# Patient Record
Sex: Male | Born: 1965 | Race: White | Hispanic: No | Smoking: Current every day smoker
Health system: Southern US, Community
[De-identification: ages and names within clinical notes are randomized; demographics above are authoritative.]

## PROBLEM LIST (undated history)

## (undated) DIAGNOSIS — F319 Bipolar disorder, unspecified: Secondary | ICD-10-CM

## (undated) DIAGNOSIS — G5602 Carpal tunnel syndrome, left upper limb: Secondary | ICD-10-CM

## (undated) DIAGNOSIS — K219 Gastro-esophageal reflux disease without esophagitis: Secondary | ICD-10-CM

## (undated) DIAGNOSIS — G709 Myoneural disorder, unspecified: Secondary | ICD-10-CM

## (undated) DIAGNOSIS — F1010 Alcohol abuse, uncomplicated: Secondary | ICD-10-CM

---

## 1999-04-06 ENCOUNTER — Emergency Department (HOSPITAL_COMMUNITY): Admission: EM | Admit: 1999-04-06 | Discharge: 1999-04-06 | Payer: Self-pay | Admitting: Emergency Medicine

## 1999-10-28 ENCOUNTER — Emergency Department (HOSPITAL_COMMUNITY): Admission: EM | Admit: 1999-10-28 | Discharge: 1999-10-28 | Payer: Self-pay | Admitting: Emergency Medicine

## 1999-11-09 ENCOUNTER — Inpatient Hospital Stay (HOSPITAL_COMMUNITY): Admission: EM | Admit: 1999-11-09 | Discharge: 1999-11-15 | Payer: Self-pay | Admitting: Psychiatry

## 1999-11-25 ENCOUNTER — Inpatient Hospital Stay (HOSPITAL_COMMUNITY): Admission: EM | Admit: 1999-11-25 | Discharge: 1999-11-29 | Payer: Self-pay | Admitting: *Deleted

## 2001-09-19 ENCOUNTER — Encounter: Payer: Self-pay | Admitting: Gastroenterology

## 2001-09-19 ENCOUNTER — Encounter: Admission: RE | Admit: 2001-09-19 | Discharge: 2001-09-19 | Payer: Self-pay | Admitting: Gastroenterology

## 2001-11-14 ENCOUNTER — Ambulatory Visit (HOSPITAL_COMMUNITY): Admission: RE | Admit: 2001-11-14 | Discharge: 2001-11-14 | Payer: Self-pay | Admitting: Gastroenterology

## 2001-11-28 ENCOUNTER — Ambulatory Visit (HOSPITAL_COMMUNITY): Admission: RE | Admit: 2001-11-28 | Discharge: 2001-11-28 | Payer: Self-pay | Admitting: Gastroenterology

## 2001-11-28 ENCOUNTER — Encounter: Payer: Self-pay | Admitting: Gastroenterology

## 2002-04-11 HISTORY — PX: CARDIAC CATHETERIZATION: SHX172

## 2002-08-29 ENCOUNTER — Inpatient Hospital Stay (HOSPITAL_COMMUNITY): Admission: EM | Admit: 2002-08-29 | Discharge: 2002-08-30 | Payer: Self-pay | Admitting: Emergency Medicine

## 2002-08-29 ENCOUNTER — Encounter: Payer: Self-pay | Admitting: Emergency Medicine

## 2002-12-24 ENCOUNTER — Encounter: Payer: Self-pay | Admitting: Emergency Medicine

## 2002-12-24 ENCOUNTER — Emergency Department (HOSPITAL_COMMUNITY): Admission: EM | Admit: 2002-12-24 | Discharge: 2002-12-24 | Payer: Self-pay | Admitting: Emergency Medicine

## 2003-05-12 ENCOUNTER — Inpatient Hospital Stay (HOSPITAL_COMMUNITY): Admission: AD | Admit: 2003-05-12 | Discharge: 2003-05-15 | Payer: Self-pay | Admitting: Psychiatry

## 2003-05-16 ENCOUNTER — Other Ambulatory Visit (HOSPITAL_COMMUNITY): Admission: RE | Admit: 2003-05-16 | Discharge: 2003-05-29 | Payer: Self-pay | Admitting: Psychiatry

## 2003-05-21 ENCOUNTER — Emergency Department (HOSPITAL_COMMUNITY): Admission: EM | Admit: 2003-05-21 | Discharge: 2003-05-22 | Payer: Self-pay | Admitting: Emergency Medicine

## 2003-06-30 ENCOUNTER — Inpatient Hospital Stay (HOSPITAL_COMMUNITY): Admission: EM | Admit: 2003-06-30 | Discharge: 2003-07-01 | Payer: Self-pay | Admitting: Emergency Medicine

## 2003-07-01 ENCOUNTER — Inpatient Hospital Stay (HOSPITAL_COMMUNITY): Admission: RE | Admit: 2003-07-01 | Discharge: 2003-07-09 | Payer: Self-pay | Admitting: Psychiatry

## 2003-07-10 ENCOUNTER — Other Ambulatory Visit (HOSPITAL_COMMUNITY): Admission: RE | Admit: 2003-07-10 | Discharge: 2003-07-10 | Payer: Self-pay | Admitting: Psychiatry

## 2004-04-15 IMAGING — CT CT HEAD W/O CM
1 of 2 series · 13 of 30 positions shown, 17 images · non-contrast
Comparison: None.

CLINICAL DATA: Probable substance overdose. 
 CRANIAL CT ? WITHOUT CONTRAST
TECHNIQUE: 5 mm axial images were obtained from the skull base through the brain to the vertex.

[Series 6: — · axial · 0.47mm/px · z∈[-236,-111]mm · 13 of 31 slices shown, 17 images]
[im 3/31  brain]
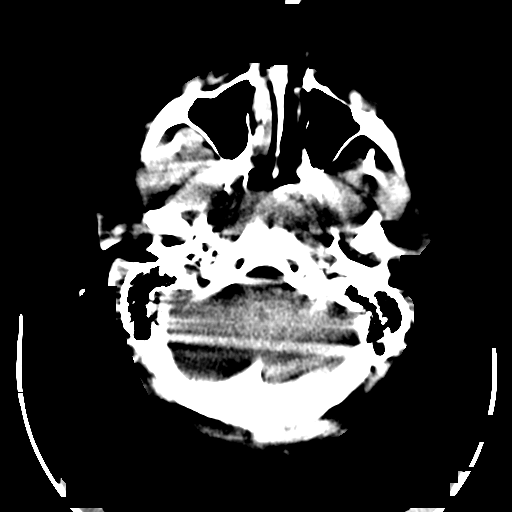
[im 3/31  bone]
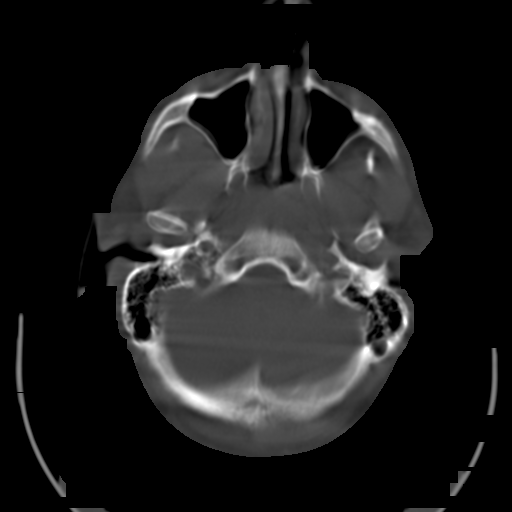
[im 5/31  brain]
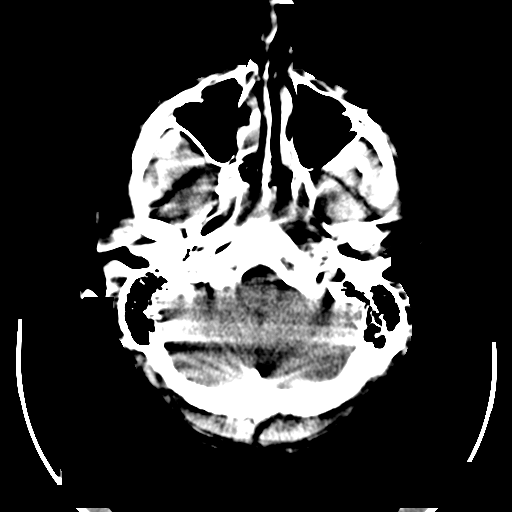
[im 7/31  brain]
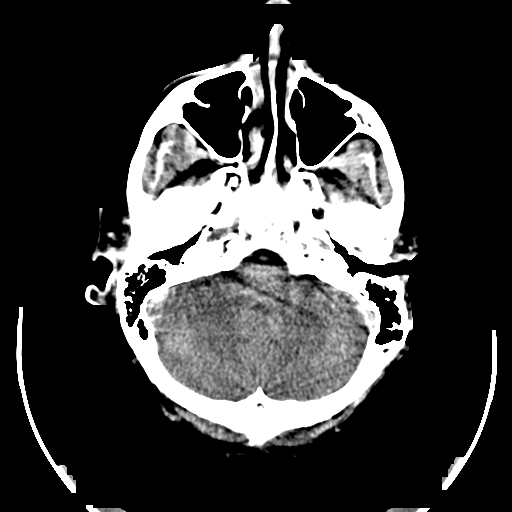
[im 9/31  brain]
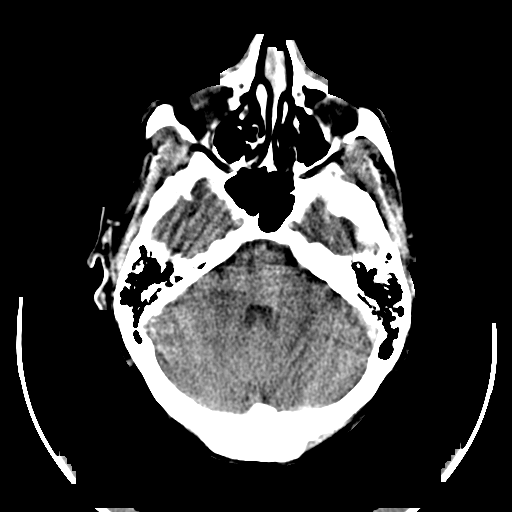
[im 11/31  brain]
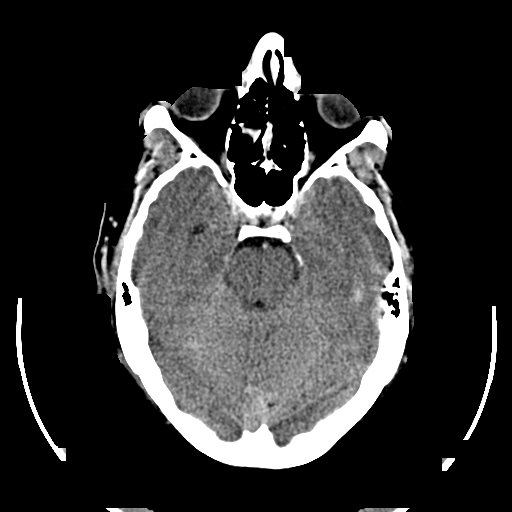
[im 11/31  bone]
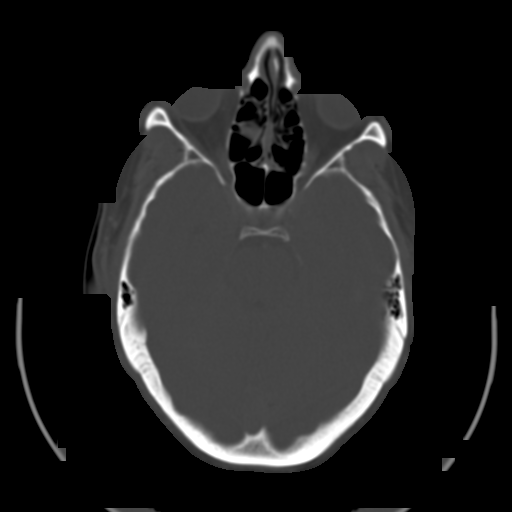
[im 13/31  brain]
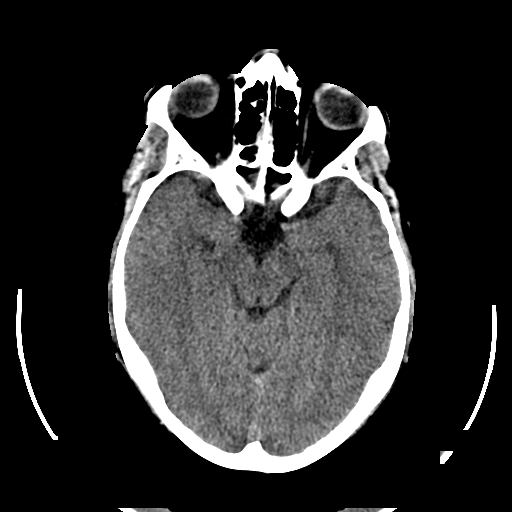
[im 16/31  brain]
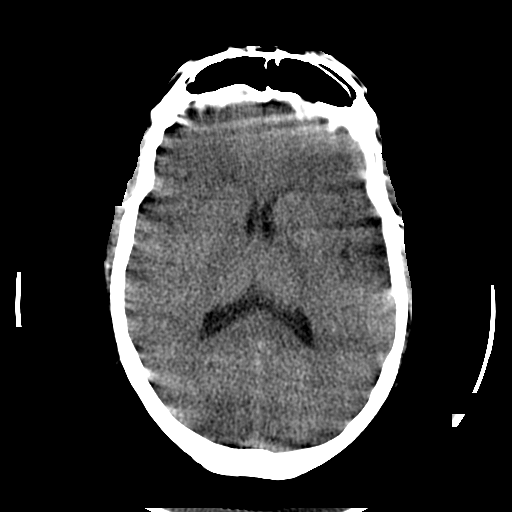
[im 18/31  brain]
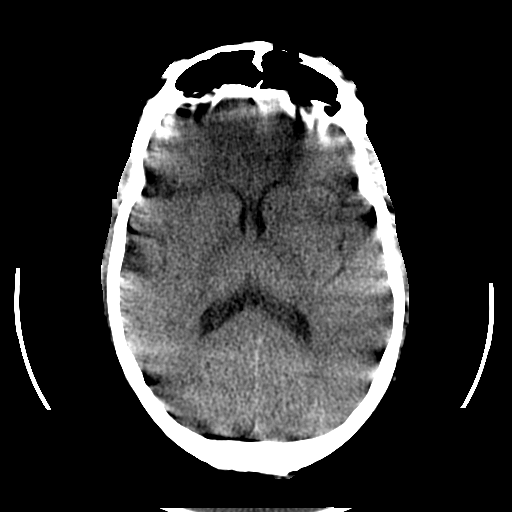
[im 20/31  brain]
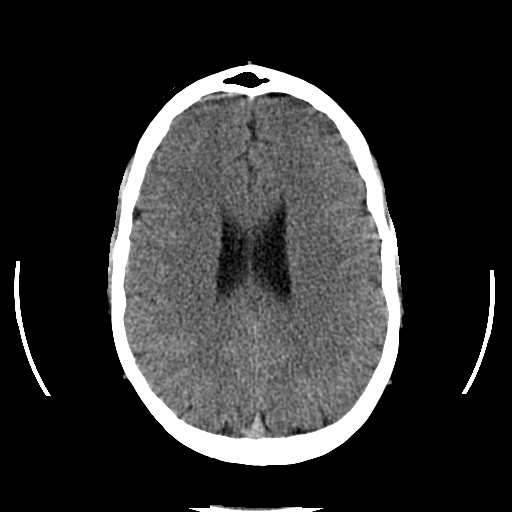
[im 20/31  bone]
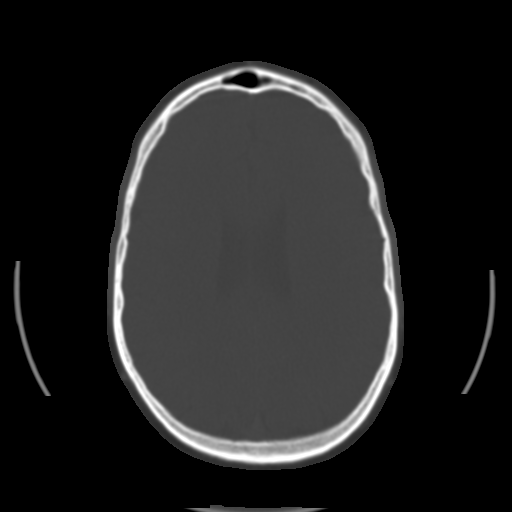
[im 22/31  brain]
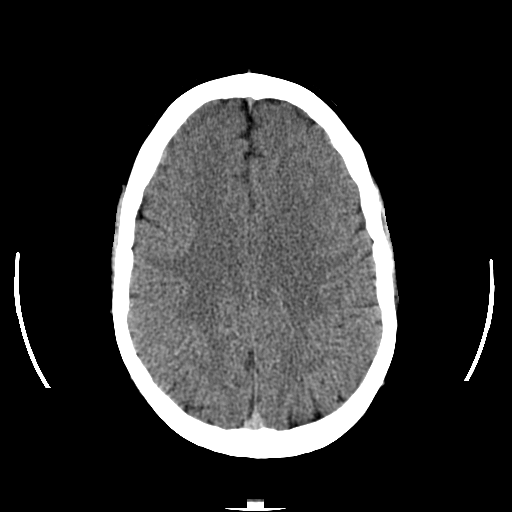
[im 24/31  brain]
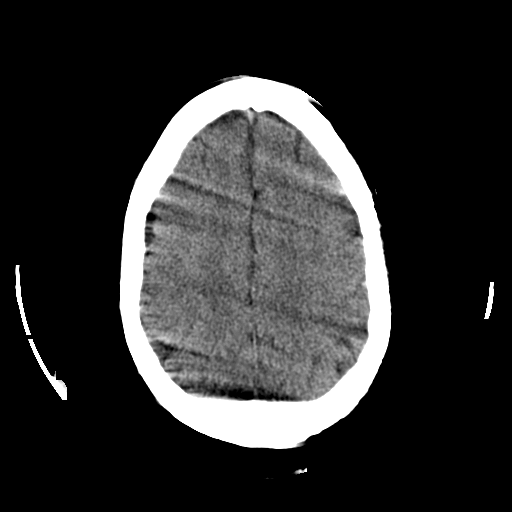
[im 26/31  brain]
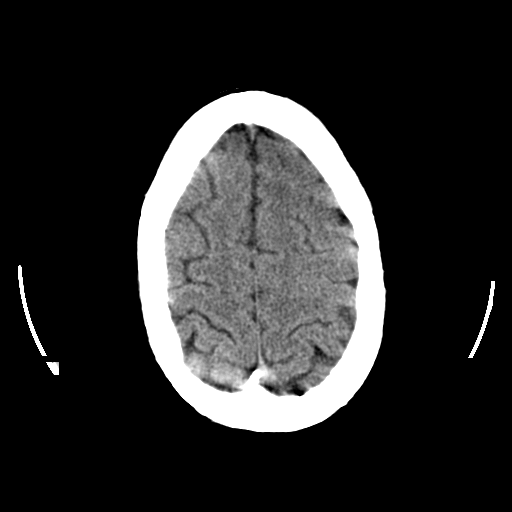
[im 28/31  brain]
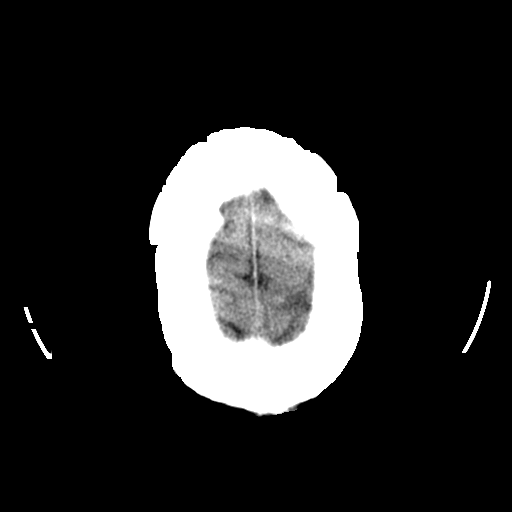
[im 28/31  bone]
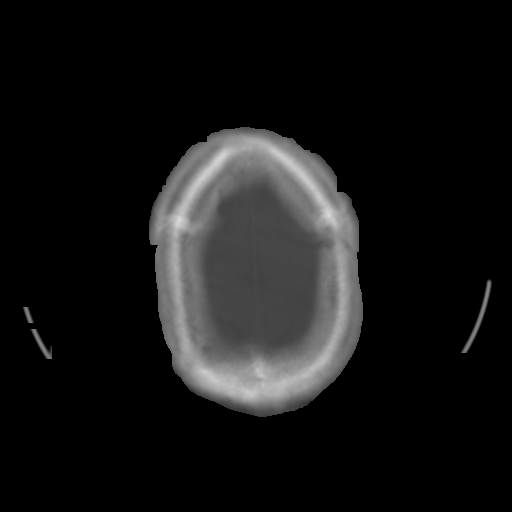

[13 of 30 positions shown; findings below may reference images not displayed]

FINDINGS: Patient motion degrades most of the images so the study is non-diagnostic.  I am able to tell that the ventricular system is normal in size and appearance.  There is no mass effect or midline shift.  I see no gross hemorrhage or hematoma.  No extra-axial fluid collections are identified. 
 The bone window images demonstrate no gross abnormalities involving the skull.  The visualized paranasal sinuses and mastoid air cells appear well aerated. 
 IMPRESSION
 Suboptimal due to patient motion.  No gross abnormalities.

## 2004-04-26 ENCOUNTER — Ambulatory Visit: Payer: Self-pay | Admitting: Psychiatry

## 2004-04-26 ENCOUNTER — Inpatient Hospital Stay (HOSPITAL_COMMUNITY): Admission: RE | Admit: 2004-04-26 | Discharge: 2004-04-30 | Payer: Self-pay | Admitting: Psychiatry

## 2004-08-06 ENCOUNTER — Emergency Department (HOSPITAL_COMMUNITY): Admission: EM | Admit: 2004-08-06 | Discharge: 2004-08-06 | Payer: Self-pay | Admitting: Emergency Medicine

## 2005-02-28 ENCOUNTER — Emergency Department (HOSPITAL_COMMUNITY): Admission: EM | Admit: 2005-02-28 | Discharge: 2005-02-28 | Payer: Self-pay | Admitting: Emergency Medicine

## 2005-05-23 ENCOUNTER — Inpatient Hospital Stay (HOSPITAL_COMMUNITY): Admission: EM | Admit: 2005-05-23 | Discharge: 2005-05-28 | Payer: Self-pay | Admitting: Psychiatry

## 2005-05-23 ENCOUNTER — Emergency Department (HOSPITAL_COMMUNITY): Admission: EM | Admit: 2005-05-23 | Discharge: 2005-05-23 | Payer: Self-pay | Admitting: Emergency Medicine

## 2005-05-26 ENCOUNTER — Ambulatory Visit (HOSPITAL_COMMUNITY): Admission: RE | Admit: 2005-05-26 | Discharge: 2005-05-26 | Payer: Self-pay | Admitting: Psychiatry

## 2005-08-28 ENCOUNTER — Emergency Department (HOSPITAL_COMMUNITY): Admission: EM | Admit: 2005-08-28 | Discharge: 2005-08-28 | Payer: Self-pay | Admitting: Emergency Medicine

## 2005-08-30 ENCOUNTER — Emergency Department (HOSPITAL_COMMUNITY): Admission: EM | Admit: 2005-08-30 | Discharge: 2005-08-30 | Payer: Self-pay | Admitting: Emergency Medicine

## 2005-10-25 ENCOUNTER — Emergency Department (HOSPITAL_COMMUNITY): Admission: EM | Admit: 2005-10-25 | Discharge: 2005-10-26 | Payer: Self-pay | Admitting: Emergency Medicine

## 2006-04-28 ENCOUNTER — Emergency Department (HOSPITAL_COMMUNITY): Admission: EM | Admit: 2006-04-28 | Discharge: 2006-04-29 | Payer: Self-pay | Admitting: Emergency Medicine

## 2006-04-28 ENCOUNTER — Emergency Department (HOSPITAL_COMMUNITY): Admission: EM | Admit: 2006-04-28 | Discharge: 2006-04-28 | Payer: Self-pay | Admitting: Emergency Medicine

## 2006-05-01 ENCOUNTER — Emergency Department (HOSPITAL_COMMUNITY): Admission: EM | Admit: 2006-05-01 | Discharge: 2006-05-01 | Payer: Self-pay | Admitting: Emergency Medicine

## 2006-05-03 ENCOUNTER — Ambulatory Visit: Payer: Self-pay | Admitting: Internal Medicine

## 2006-05-03 ENCOUNTER — Inpatient Hospital Stay (HOSPITAL_COMMUNITY): Admission: AD | Admit: 2006-05-03 | Discharge: 2006-05-06 | Payer: Self-pay | Admitting: Internal Medicine

## 2006-05-12 ENCOUNTER — Ambulatory Visit: Payer: Self-pay | Admitting: Internal Medicine

## 2006-06-01 ENCOUNTER — Ambulatory Visit: Payer: Self-pay | Admitting: Physical Medicine & Rehabilitation

## 2006-06-01 ENCOUNTER — Encounter
Admission: RE | Admit: 2006-06-01 | Discharge: 2006-08-30 | Payer: Self-pay | Admitting: Physical Medicine & Rehabilitation

## 2006-08-31 ENCOUNTER — Ambulatory Visit: Payer: Self-pay | Admitting: Psychiatry

## 2006-08-31 ENCOUNTER — Inpatient Hospital Stay (HOSPITAL_COMMUNITY): Admission: RE | Admit: 2006-08-31 | Discharge: 2006-09-07 | Payer: Self-pay | Admitting: Psychiatry

## 2006-09-03 ENCOUNTER — Emergency Department (HOSPITAL_COMMUNITY): Admission: EM | Admit: 2006-09-03 | Discharge: 2006-09-04 | Payer: Self-pay | Admitting: Emergency Medicine

## 2006-09-05 ENCOUNTER — Ambulatory Visit: Payer: Self-pay | Admitting: Vascular Surgery

## 2006-09-05 ENCOUNTER — Encounter (HOSPITAL_COMMUNITY): Payer: Self-pay | Admitting: Psychiatry

## 2006-12-06 ENCOUNTER — Ambulatory Visit (HOSPITAL_COMMUNITY): Payer: Self-pay | Admitting: Psychiatry

## 2006-12-24 ENCOUNTER — Inpatient Hospital Stay (HOSPITAL_COMMUNITY): Admission: EM | Admit: 2006-12-24 | Discharge: 2006-12-29 | Payer: Self-pay | Admitting: Emergency Medicine

## 2006-12-29 ENCOUNTER — Inpatient Hospital Stay (HOSPITAL_COMMUNITY): Admission: EM | Admit: 2006-12-29 | Discharge: 2007-01-01 | Payer: Self-pay | Admitting: Psychiatry

## 2007-01-01 ENCOUNTER — Ambulatory Visit: Payer: Self-pay | Admitting: Psychiatry

## 2007-01-03 ENCOUNTER — Ambulatory Visit (HOSPITAL_COMMUNITY): Payer: Self-pay | Admitting: Psychiatry

## 2007-01-31 ENCOUNTER — Ambulatory Visit (HOSPITAL_COMMUNITY): Payer: Self-pay | Admitting: Psychiatry

## 2010-02-07 ENCOUNTER — Emergency Department (HOSPITAL_COMMUNITY): Admission: EM | Admit: 2010-02-07 | Discharge: 2010-02-07 | Payer: Self-pay | Admitting: Emergency Medicine

## 2010-05-02 ENCOUNTER — Encounter: Payer: Self-pay | Admitting: Physical Medicine & Rehabilitation

## 2010-06-10 HISTORY — PX: MANDIBLE FRACTURE SURGERY: SHX706

## 2010-06-13 ENCOUNTER — Emergency Department (HOSPITAL_COMMUNITY)
Admission: EM | Admit: 2010-06-13 | Discharge: 2010-06-13 | Disposition: A | Discharge status: Home / Self Care | Payer: Managed Care, Other (non HMO) | Attending: Emergency Medicine | Admitting: Emergency Medicine

## 2010-06-13 ENCOUNTER — Emergency Department (HOSPITAL_COMMUNITY): Payer: Managed Care, Other (non HMO)

## 2010-06-13 DIAGNOSIS — S0993XA Unspecified injury of face, initial encounter: Secondary | ICD-10-CM | POA: Insufficient documentation

## 2010-06-13 DIAGNOSIS — S025XXA Fracture of tooth (traumatic), initial encounter for closed fracture: Secondary | ICD-10-CM | POA: Insufficient documentation

## 2010-06-13 DIAGNOSIS — S02600A Fracture of unspecified part of body of mandible, initial encounter for closed fracture: Secondary | ICD-10-CM | POA: Insufficient documentation

## 2010-06-13 DIAGNOSIS — K219 Gastro-esophageal reflux disease without esophagitis: Secondary | ICD-10-CM | POA: Insufficient documentation

## 2010-06-13 DIAGNOSIS — Y93E1 Activity, personal bathing and showering: Secondary | ICD-10-CM | POA: Insufficient documentation

## 2010-06-13 DIAGNOSIS — K089 Disorder of teeth and supporting structures, unspecified: Secondary | ICD-10-CM | POA: Insufficient documentation

## 2010-06-13 DIAGNOSIS — R6884 Jaw pain: Secondary | ICD-10-CM | POA: Insufficient documentation

## 2010-06-13 DIAGNOSIS — Y92009 Unspecified place in unspecified non-institutional (private) residence as the place of occurrence of the external cause: Secondary | ICD-10-CM | POA: Insufficient documentation

## 2010-06-13 DIAGNOSIS — I10 Essential (primary) hypertension: Secondary | ICD-10-CM | POA: Insufficient documentation

## 2010-06-13 DIAGNOSIS — Z79899 Other long term (current) drug therapy: Secondary | ICD-10-CM | POA: Insufficient documentation

## 2010-06-13 DIAGNOSIS — R220 Localized swelling, mass and lump, head: Secondary | ICD-10-CM | POA: Insufficient documentation

## 2010-06-13 DIAGNOSIS — W010XXA Fall on same level from slipping, tripping and stumbling without subsequent striking against object, initial encounter: Secondary | ICD-10-CM | POA: Insufficient documentation

## 2010-06-13 DIAGNOSIS — K1370 Unspecified lesions of oral mucosa: Secondary | ICD-10-CM | POA: Insufficient documentation

## 2010-06-13 DIAGNOSIS — S199XXA Unspecified injury of neck, initial encounter: Secondary | ICD-10-CM | POA: Insufficient documentation

## 2010-06-13 LAB — POCT I-STAT, CHEM 8
BUN: 12 mg/dL (ref 6–23)
Calcium, Ion: 1.07 mmol/L — ABNORMAL LOW (ref 1.12–1.32)
Chloride: 107 mEq/L (ref 96–112)
Creatinine, Ser: 0.8 mg/dL (ref 0.4–1.5)
Glucose, Bld: 102 mg/dL — ABNORMAL HIGH (ref 70–99)
HCT: 53 % — ABNORMAL HIGH (ref 39.0–52.0)
Potassium: 4.2 mEq/L (ref 3.5–5.1)
Sodium: 137 mEq/L (ref 135–145)
TCO2: 22 mmol/L (ref 0–100)

## 2010-06-13 LAB — CBC
HCT: 48.9 % (ref 39.0–52.0)
Hemoglobin: 17.8 g/dL — ABNORMAL HIGH (ref 13.0–17.0)
MCHC: 36.4 g/dL — ABNORMAL HIGH (ref 30.0–36.0)
MCV: 86.4 fL (ref 78.0–100.0)
Platelets: 230 10*3/uL (ref 150–400)
RBC: 5.66 MIL/uL (ref 4.22–5.81)
RDW: 12.8 % (ref 11.5–15.5)
WBC: 12.6 10*3/uL — ABNORMAL HIGH (ref 4.0–10.5)

## 2010-06-13 LAB — DIFFERENTIAL
Eosinophils Absolute: 0 10*3/uL (ref 0.0–0.7)
Lymphocytes Relative: 23 % (ref 12–46)
Lymphs Abs: 2.8 10*3/uL (ref 0.7–4.0)
Monocytes Absolute: 1.1 10*3/uL — ABNORMAL HIGH (ref 0.1–1.0)
Monocytes Relative: 9 % (ref 3–12)
Neutrophils Relative %: 68 % (ref 43–77)

## 2010-06-22 NOTE — Op Note (Signed)
NAME:  [name], [name]               ACCOUNT NO.:  [id number]  MEDICAL RECORD NO.:  [id number]           PATIENT TYPE:  E  LOCATION:  [facility]                         FACILITY:  [facility]  PHYSICIAN:  Jniya Madara T. Lazarus Salines, M.D. DATE OF BIRTH:  12/23/1965  DATE OF PROCEDURE:  06/13/2010 DATE OF DISCHARGE:                              OPERATIVE REPORT   PREOPERATIVE DIAGNOSIS:  Left mandibular angle fracture.  POSTOPERATIVE DIAGNOSIS:  Left mandibular angle fracture.  PROCEDURE PERFORMED:  Mandibular-maxillary fixation.  SURGEON:  Gloris Manchester. Chrystopher Stangl, MD  ANESTHESIA:  General nasotracheal.  BLOOD LOSS:  Minimal.  COMPLICATIONS:  None.  FINDINGS:  Crowded dentition.  A posterior vertical height open bite deformity on the left with a laceration in the mandibular alveolar mucosa just behind the most-posterior tooth.  Jaw readily brought into good occlusion and secured fixation.  PROCEDURE:  With the patient in a comfortable supine position, having received preoperative Neo-Synephrine nasal spray, general nasotracheal anesthesia was administered without difficulty.  At an appropriate level, the patient was placed in a slight sitting position.  A 2 x 2 gauze with a 2-0 silk suture tied to it and was used as a throat pack and was placed without difficulty.  The dentition was scrubbed with Betadine solution and a toothbrush and the external face was cleaned with the same solution.  Xylocaine 1% with 1:100,000 epinephrine, 6 mL total was infiltrated into the proposed puncture sites for the mandibulomaxillary fixation screws.  Several minutes were allowed for this take effect.  The oral cavity was rinsed and suctioned clear.  The #15 blade punctures were made medial and superior to the roots of the canine teeth on the maxilla lateral to the piriform aperture and on the mandible.  A 12-mm bicortical screws were placed in the standard fashion and were secured.  Starting with a 22 gauge wire, a  single loop was applied vertically on the right side and with a tongue carefully placed back into the oral cavity and with the teeth gently brought into occlusion, this was secured.  It was felt that this was too large wire and the remainder of the wires were 24-gauge.  An additional vertical loop was placed on the left side both loosely tightened until the occlusion was established and then more securely snugged down and then cut off.  Two crisscross loops were applied of the 24 gauge wire. Excellent occlusion was observed and good fixation.  The cut ends of the twisted wires were twisted down to be buried and the wires were adjusted so that they were not overlying any excessive amount of soft tissue.  At this point, a hemostasis was observed.  The oral cavity was irrigated and suctioned clear.  The patient was returned to Anesthesia, awakened, extubated, and transferred to the recovery in stable condition.  COMMENT:  A 45 year old white male fell last night in the bathtub sustaining a fracture of the left angle of the mandible as the indication for today's procedure.  Anticipate routine postoperative recovery with attention to ice, elevation, analgesia.  We will give him liquid diet instructions.  We will send him home with  wire cutters in case he should have to loosen his occlusion for emesis.  Given low anticipated risk of postanesthetic or postsurgical complications, I feel an outpatient venue is appropriate.     Gloris Manchester. Lazarus Salines, M.D.     KTW/MEDQ  D:  06/13/2010  T:  06/14/2010  Job:  [id number]  Electronically Signed by [name] M.D. on 06/22/2010 08:04:21 AM

## 2010-06-22 NOTE — Consult Note (Signed)
  NAME:  [name], [name]               ACCOUNT NO.:  [id number]  MEDICAL RECORD NO.:  [id number]           PATIENT TYPE:  E  LOCATION:  [facility]                         FACILITY:  [facility]  PHYSICIAN:  Chaka Boyson T. Lazarus Salines, M.D. DATE OF BIRTH:  1966-04-06  DATE OF CONSULTATION: DATE OF DISCHARGE:                                CONSULTATION   CHIEF COMPLAINT:  Jaw pain.  HISTORY OF PRESENT ILLNESS:  A 45 year old white male slipped and fell in the bathtub roughly 12 hours ago.  Within an hour or two, he was having intense pain in the left side of his jaw and came in for evaluation.  He had a small amount of oral bleeding.  No breathing difficulty.  No prior history of jaw fracture.  He notes numbness over his left chin, and difficulty closing his mouth to bring his teeth into occlusion.  PAST MEDICAL HISTORY:  No prior surgeries.  No current active medical conditions except reflux.  He had a prior colonoscopy.  FAMILY HISTORY:  Negative for bleeding or anesthesia reaction.  SOCIAL HISTORY:  Two pack-per-day smoker.  Occasional heavy binge drinker.  No known drug allergies.  Current medications include Prilosec.  REVIEW OF SYSTEMS:  Noncontributory.  PHYSICAL EXAMINATION:  VITAL SIGNS:  Temperature 98.3, pulse 120, respirations 20, blood pressure 185/106. HEENT:  He has tender swelling over the left angle of the mandible without crepitus or ecchymosis.  Otherwise, the face appears atraumatic. Ear canals are clear with aerated drums.  Anterior nose is dry consistent with his smoking.  Oral cavity reveals teeth for the most part in good repair.  There is a mucosal laceration over the left posterior mandibular alveolus.  He has a mild open bite deformity with pain on attempting to bring his teeth in to better occlusion. Oropharynx is fleshy, but clear. NECK:  Without adenopathy.  IMPRESSION:  Left mandibular angle fracture.  PLAN:  I reviewed the CT scan demonstrating the findings as  above with a displaced angle fracture behind the posterior mandibular teeth.  He may need mandibular maxillary fixation to stabilize his occlusion and preserved vertical height.  I have discussed this with him including risks and complications.  Questions were answered and informed consent was obtained.  A routine preoperative history and physical was recorded in the standard fashion.     Gloris Manchester. Lazarus Salines, M.D.     KTW/MEDQ  D:  06/13/2010  T:  06/14/2010  Job:  [id number]  Electronically Signed by [name] M.D. on 06/22/2010 08:04:26 AM

## 2010-07-04 ENCOUNTER — Emergency Department (HOSPITAL_COMMUNITY): Payer: Managed Care, Other (non HMO)

## 2010-07-04 ENCOUNTER — Emergency Department (HOSPITAL_COMMUNITY)
Admission: EM | Admit: 2010-07-04 | Discharge: 2010-07-04 | Disposition: A | Discharge status: Home / Self Care | Payer: Managed Care, Other (non HMO) | Attending: Emergency Medicine | Admitting: Emergency Medicine

## 2010-07-04 DIAGNOSIS — R000 Tachycardia, unspecified: Secondary | ICD-10-CM | POA: Insufficient documentation

## 2010-07-04 DIAGNOSIS — W010XXA Fall on same level from slipping, tripping and stumbling without subsequent striking against object, initial encounter: Secondary | ICD-10-CM | POA: Insufficient documentation

## 2010-07-04 DIAGNOSIS — K219 Gastro-esophageal reflux disease without esophagitis: Secondary | ICD-10-CM | POA: Insufficient documentation

## 2010-07-04 DIAGNOSIS — S0993XA Unspecified injury of face, initial encounter: Secondary | ICD-10-CM | POA: Insufficient documentation

## 2010-07-04 DIAGNOSIS — K1370 Unspecified lesions of oral mucosa: Secondary | ICD-10-CM | POA: Insufficient documentation

## 2010-07-04 DIAGNOSIS — R6884 Jaw pain: Secondary | ICD-10-CM | POA: Insufficient documentation

## 2010-07-04 DIAGNOSIS — I10 Essential (primary) hypertension: Secondary | ICD-10-CM | POA: Insufficient documentation

## 2010-07-04 DIAGNOSIS — S199XXA Unspecified injury of neck, initial encounter: Secondary | ICD-10-CM | POA: Insufficient documentation

## 2010-07-04 DIAGNOSIS — Y929 Unspecified place or not applicable: Secondary | ICD-10-CM | POA: Insufficient documentation

## 2010-07-04 DIAGNOSIS — R509 Fever, unspecified: Secondary | ICD-10-CM | POA: Insufficient documentation

## 2010-07-04 LAB — DIFFERENTIAL
Basophils Absolute: 0 10*3/uL (ref 0.0–0.1)
Basophils Relative: 0 % (ref 0–1)
Eosinophils Absolute: 0.2 10*3/uL (ref 0.0–0.7)
Eosinophils Relative: 2 % (ref 0–5)
Lymphocytes Relative: 32 % (ref 12–46)
Lymphs Abs: 2.4 10*3/uL (ref 0.7–4.0)
Monocytes Absolute: 0.6 10*3/uL (ref 0.1–1.0)
Monocytes Relative: 8 % (ref 3–12)
Neutro Abs: 4.4 10*3/uL (ref 1.7–7.7)

## 2010-07-04 LAB — CBC
HCT: 46.3 % (ref 39.0–52.0)
MCHC: 36.3 g/dL — ABNORMAL HIGH (ref 30.0–36.0)
Platelets: 279 10*3/uL (ref 150–400)
RDW: 12.5 % (ref 11.5–15.5)

## 2010-07-04 LAB — BASIC METABOLIC PANEL
BUN: 5 mg/dL — ABNORMAL LOW (ref 6–23)
Calcium: 9 mg/dL (ref 8.4–10.5)
GFR calc non Af Amer: >60 mL/min (ref >60)
Glucose, Bld: 101 mg/dL — ABNORMAL HIGH (ref 70–99)
Sodium: 134 mEq/L — ABNORMAL LOW (ref 135–145)

## 2010-07-21 ENCOUNTER — Other Ambulatory Visit: Payer: Self-pay | Admitting: Otolaryngology

## 2010-07-21 ENCOUNTER — Ambulatory Visit (HOSPITAL_COMMUNITY)
Admission: RE | Admit: 2010-07-21 | Discharge: 2010-07-21 | Disposition: A | Discharge status: Home / Self Care | Payer: Managed Care, Other (non HMO) | Source: Ambulatory Visit | Attending: Otolaryngology | Admitting: Otolaryngology

## 2010-07-21 DIAGNOSIS — R52 Pain, unspecified: Secondary | ICD-10-CM

## 2010-07-21 DIAGNOSIS — R6884 Jaw pain: Secondary | ICD-10-CM | POA: Insufficient documentation

## 2010-07-21 DIAGNOSIS — IMO0001 Reserved for inherently not codable concepts without codable children: Secondary | ICD-10-CM | POA: Insufficient documentation

## 2010-08-24 NOTE — Discharge Summary (Signed)
NAME:  [name], [name]               ACCOUNT NO.:  [id number]   MEDICAL RECORD NO.:  [id number]          PATIENT TYPE:  IPS   LOCATION:  0307                          FACILITY:  [facility]   PHYSICIAN:  Geoffery Lyons, M.D.      DATE OF BIRTH:  [birth year]   DATE OF ADMISSION:  12/29/2006  DATE OF DISCHARGE:  01/01/2007                               DISCHARGE SUMMARY   CHIEF COMPLAINT AND PRESENT ILLNESS:  This was the fifth admission to  [facility] for this 45 year old divorced white male  who entered the hospital on December 24, 2006 as a result of an  overdose.  He had taken 10-12 tablets of 300 mg Neurontin and clonidine  0.2 mg, unknown number.  He was started on a dopamine drip and admitted  to the cardiac intensive care unit, stabilized and transferred to the  main floor.  Experienced also acute bronchitis, was given Avelox,  Mucinex and albuterol.  Was seen in consultation by Dr. Jeanie Sewer.  Apparently, he ran out of the Neurontin and began to reuse alcohol, a  half to a fifth of alcohol per day for four weeks.  Developed increased  depressed mood.  Difficulty concentrating with anhedonia, insomnia,  suicidal thoughts, excessive worry, feeling on edge, having muscle  tension.   PAST PSYCHIATRIC HISTORY:  Fifth time at [facility].  Did not  follow up last time he was admitted.   ALCOHOL/DRUG HISTORY:  Using alcohol on and off for the past few years.   MEDICAL HISTORY:  Hypertension, gastroesophageal reflux.   MEDICATIONS:  Transferred from the main hospital on Avelox 400 mg per  day, clonidine 0.2 mg three times a day, Mucinex 600 mg twice a day,  labetalol 200 mg twice a day.   PHYSICAL EXAMINATION:  Performed and failed to show any acute findings.   LABORATORY DATA:  CBC revealed white blood cells 5.1, hemoglobin 16.5.  Sodium 136, potassium 3.9, glucose 111, BUN 9, creatinine 1.12.  SGOT  38, SGPT 44, TSH 4.279.   MENTAL STATUS EXAM:  Alert, cooperative  male.  Mood was somewhat  irritable.  Affect was depressed.  Thought processes were clear,  rational and goal-oriented.  Pretty knowledgeable about his medication,  what he would and would not take.  No active suicidal or homicidal  ideation.  No evidence of delusions.  No hallucinations.  Cognition well-  preserved.   ADMISSION DIAGNOSES:  AXIS I:  Bipolar disorder, depressed.  Anxiety  disorder not otherwise specified.  Alcohol dependence.  AXIS II:  No diagnosis.  AXIS III:  Hypertension, gastroesophageal reflux, bronchitis.  AXIS IV:  Moderate.  AXIS V:  GAF upon admission 30; highest GAF in the last year 65-70.   HOSPITAL COURSE:  He was admitted and started in individual and group  psychotherapy.  He was maintained on his medications.  As already  stated, transferred from the medical unit when medically stable, status  post overdose with clonidine and Neurontin.  Developed depressed mood,  decreased concentration, anhedonia, insomnia, suicidal thoughts.  He was  insightful about how he started using  alcohol and how it became a  problem.  Has been on multiple medications, feeling sedated.  By  December 31, 2006, endorsed that he could not do this anymore.  He was  wanting to get his life back together, committed to abstinence.  Endorsed that he resolved not to drink but, by the end of the day, he  was already craving it, willing to use Antabuse but he was denying any  suicidal thoughts.  Endorsed that alcohol plays a big role in all that  goes on with him so he was placed on Antabuse 250 mg per day.  On  January 01, 2007, he was in full contact with reality.  No suicidal or  homicidal ideation.  No hallucinations.  No delusions.  Committed to  abstinence.  Endorsed that he was not wanting to hurt himself and that  this gesture was stupid.  Endorsed that he was going to follow up on  an outpatient basis.   DISCHARGE DIAGNOSES:  AXIS I:  Alcohol dependence.  Mood disorder not   otherwise specified.  AXIS II:  No diagnosis.  AXIS III:  Gastroesophageal reflux, arterial hypertension.  AXIS IV:  Moderate.  AXIS V:  GAF upon discharge 55-60.   DISCHARGE MEDICATIONS:  1. Protonix 40 mg per day.  2. Avelox 400 mg daily three more days.  3. Mucinex 600 mg twice a day.  4. Normodyne 200 mg twice a day.  5. Catapres 0.2 mg three times a day.  6. Neurontin 300 mg three times a day.   FOLLOWUP:  Yolande Jolly, PA at [facility].      Geoffery Lyons, M.D.  Electronically Signed     IL/MEDQ  D:  01/22/2007  T:  01/23/2007  Job:  [id number]

## 2010-08-24 NOTE — Consult Note (Signed)
NAME:  [name], [name]               ACCOUNT NO.:  [id number]   MEDICAL RECORD NO.:  [id number]           PATIENT TYPE:   LOCATION:                                 FACILITY:   PHYSICIAN:  Antonietta Breach, M.D.  DATE OF BIRTH:  1965-05-09   DATE OF CONSULTATION:  12/28/2006  DATE OF DISCHARGE:                                 CONSULTATION   [name] continues with severe depressed mood, low energy, difficulty  concentrating, anhedonia.  He stays in his bed most the time.   He is not having any hallucinations or delusions.  He is oriented  completely.  He is cooperative with bedside care.   LABORATORY DATA:  SGOT 31, SGPT 40, WBC 5.4, hemoglobin 15.7, platelet  count 235.   REVIEW OF SYSTEMS:  The patient is not having any nausea or vomiting.  He is not having any shakes or sweats.  He has received a milligram of  Ativan today and has finished his withdrawal protocol.   EXAMINATION:  VITAL SIGNS:  Temperature 98.2, pulse 85, respiratory rate  22, blood pressure 146/93, O2 saturation on room air is 98%.   MENTAL STATUS EXAM:  As above.  The patient is alert.  His affect is  very constricted.  His mood is depressed.  His eye contact is partial.  Thought process is logical, coherent, goal-directed.  No looseness of  associations.  Thought content, the patient does acknowledge suicidal  intent on his overdose.  He has thoughts of hopelessness and  helplessness.  No hallucinations or delusions.  His insight is intact  for his substance dependence and depression.  His judgment is intact for  the need of treatment,   ASSESSMENT:  AXIS I:  293.84, anxiety disorder not otherwise specified.  Rule out 296.80, bipolar disorder not otherwise specified, depressed.  Alcohol dependence.   RECOMMENDATIONS:  Would admit to a psychiatric ward once medically  cleared.  Next, would continue his thiamine 100 mg q. day, a  multivitamin q. day, and folic acid 1 mg q. day indefinitely.  Modifications  of his psychotropic regimen are deferred at this time.      Antonietta Breach, M.D.  Electronically Signed     JW/MEDQ  D:  01/01/2007  T:  01/02/2007  Job:  [id number]

## 2010-08-24 NOTE — Discharge Summary (Signed)
NAME:  [name], [name]               ACCOUNT NO.:  [id number]   MEDICAL RECORD NO.:  [id number]          PATIENT TYPE:  INP   LOCATION:  5732                         FACILITY:  [facility]   PHYSICIAN:  Mobolaji B. Bakare, M.D.DATE OF BIRTH:  Dec 13, 1965   DATE OF ADMISSION:  12/24/2006  DATE OF DISCHARGE:  12/29/2006                               DISCHARGE SUMMARY   PRIMARY CARE PHYSICIAN:  Unassigned.   CONSULTATIONS:  Psychiatric consult provided by Antonietta Breach, M.D.   DISCHARGE DIAGNOSES:  1. Suicide attempt with clonidine and gabapentin.  2. Overdose with clonidine and gabapentin.  3. Depressive disorder.  4. Anxiety disorder.  5. Hypotension, resolved.  6. Hypertension, controlled.  7. Acute bronchitis.  8. Alcohol dependence.  9. Tobacco abuse.   BRIEF HISTORY:  Please refer to the admission H&P.  Briefly, the patient  is a 45 year old Caucasian male with history of multiple suicidal  attempts in the past.  He presented to the emergency room after overdose  on 10-12 tablets of gabapentin which were 300 mg and clonidine 0.2 mg  dose unknown.  On arrival in the emergency room, he was hypotensive with  a blood pressure of 176/57.  He was somnolent.  He was started on  dopamine drip and admitted to the cardiac intensive care unit.   HOSPITAL COURSE:  Problem 1.  Depressive disorder.  The patient was seen  by Dr. Jeanie Sewer when he became more alert.  He had 24-hour sitter with  him.  It is noted that the patient has received inpatient treatment  previously and he has had multiple suicidal attempts.  It was felt  necessary that he should go for inpatient psychiatric treatment.  Arrangement is being made for transfer to behavioral health.  As of the  time of dictation, the patient acknowledges suicidal intent.   Problem 2.  Hypotension.  This is secondary to overdose on clonidine.  Exact number of clonidine 0.2 mg he used is unknown.  He responded well  to dopamine infusion and  IV fluids.  This was titrated up after 24 hours  of treatment.  He subsequently became hypertensive and clonidine was  restarted.  At this point titrating his clonidine to his previous home  dose.  Blood pressure was still uncontrolled, hence, labetalol was added  and blood pressure seems to be well controlled.   Problem 3.  Acute bronchitis.  During the course of hospitalization, the  patient developed cough associated with fever.  Cough was unproductive  of sputum.  He had a chest x-ray done which did not show pneumonia.  He  also complained of congestion.  Blood culture drawn had shown no growth  so far.  The patient was treated for acute bronchitis.  It should be  mentioned that one of his sitters was reportedly sick with the same  symptoms.  Fever has normalized.  The patient is feeling much better.  He will continue Avelox 400 mg daily for five more days to complete  seven days of treatment.  Albuterol inhaler to help with bronchospasm.  The patient reported pleuritic chest pain.  His  chest x-ray was normal  without pneumonia.  The pleuritic chest pain is felt to be secondary to  acute bronchitis.  He is responding well to pain medication.   Problem 4.  Alcohol dependence.  The patient showed no sign of alcohol  withdrawal during the course of hospitalization.  The patient was placed  on Ativan withdrawal protocol and he is receiving the last dose of  Ativan today.   DISCHARGE MEDICATIONS:  1. Avelox 400 mg p.o. daily to complete treatment on January 03, 2007.  2. Clonidine 0.2 mg p.o. t.i.d.  3. Mucinex 600 mg b.i.d.  4. Labetalol 200 mg p.o. b.i.d.  5. Multivitamin one daily.  6. Nicotine patch 21 mg daily.  7. Thiamine 100 mg daily.  8. Tylenol 650 mg q.4 hours p.r.n. pain or headaches.  9. Albuterol inhaler 2.5 mg q.6 hours and q.2 hours p.r.n. for two      weeks.  10.Guaifenesin 600 mg p.o. b.i.d. for two weeks.  11.Oxycodone 5 mg p.o. q.4 hours p.r.n. pain.    CONDITION ON DISCHARGE:  Stable.   DISCHARGE VITAL SIGNS:  Temperature 98.6, pulse 93, respiratory rate 20,  blood pressure 117/77, O2 saturations 93% on room air.      Mobolaji B. Corky Downs, M.D.  Electronically Signed     MBB/MEDQ  D:  12/29/2006  T:  12/29/2006  Job:  [id number]   cc:   Antonietta Breach, M.D.

## 2010-08-24 NOTE — H&P (Signed)
NAME:  [name], [name]               ACCOUNT NO.:  [id number]   MEDICAL RECORD NO.:  [id number]          PATIENT TYPE:  INP   LOCATION:  2910                         FACILITY:  [facility]   PHYSICIAN:  Marcellus Vandy, MD     DATE OF BIRTH:  1965-10-10   DATE OF ADMISSION:  12/24/2006  DATE OF DISCHARGE:                              HISTORY & PHYSICAL   PRIMARY MEDICAL DOCTOR:  Unassigned.   CHIEF COMPLAINT:  Drug overdose: Gabapentin and Clonidine.   HISTORY OF PRESENT ILLNESS:  [name] is a 45 year old Caucasian male  patient with history of hypertension, alcohol dependence, psychiatric  illness with multiple admissions to [facility].  He presented to  the emergency room and indicated that he had overdosed on 10-12 tablets  each of gabapentin 300 mg and clonidine 0.2 mg at approximately 12 noon,  after an argument with his girlfriend.  He denied attempting suicide.  He voluntarily drank the activated charcoal that was provided in the ED.  Subsequently, he progressively became somnolent.  His blood pressure  also dropped to 76/57, following which a dopamine drip was started.  Subsequently, however, the blood pressure shot up to 170 systolic when  the dopamine drip was turned off.  The patient at one point was  unresponsive to sternal rub or any stimulus but subsequently woke up and  provided some history.  He indicated that he was just frustrated and  took those tablets and currently he is feeling tired and thirsty but  denies any other complaints.  He denies any delusions, hallucinations,  suicidal or homicidal ideations.  He again categorically stated that he  did not try to hurt himself.  The critical care physician was consulted  by the emergency room physician.  Since the patient is maintaining his  airway and is currently hemodynamically stable, they have advised  observation and to call them if necessary.   PAST MEDICAL HISTORY:  1. Hypertension.  2. Alcohol  dependence/abuse.  3. History of alcohol detox.  4. Gastroesophageal reflux disease.   PSYCHIATRIC HISTORY:  1. Multiple admissions at [facility], the last one in June      2008.  2. Mood disorder, not otherwise specified.  3. Anxiety disorder.  4. Questionable bipolar disorder.   ALLERGIES:  No known drug allergies.   MEDICATIONS:  1. Gabapentin 300 mg p.o. three times a day.  2. Clonidine 0.2 mg p.o. three times a day.   FAMILY HISTORY:  Father died of lung cancer.   SOCIAL HISTORY:  The patient is divorced and has 2 teenage kids.  He has  had problems with his ex-wife in the past according to the E-Chart.  The  patient is currently living with his girlfriend.  He smokes 1.5 packs of  cigarettes per day.  He claims he drinks about one fifth of liquor 3  times a week and the last time was this morning.  He denies abusing  drugs.   REVIEW OF SYSTEMS:  Limited secondary to the patient's poor mental  status and cooperation, however, apart from the history of presenting  illness,  the 14 systems reviewed and is noncontributory.   PHYSICAL EXAMINATION:  GENERAL:  [name] is a moderately built and  obese male patient in no obvious distress.  VITAL SIGNS:  Temperature is 98.6, blood pressure 100/50, pulse 95 per  minute, respirations 24 per minute, saturating at 98-100% on room air.  HEENT:  Nontraumatic, normocephalic.  Pupils are 2-mm bilaterally,  sluggishly reacting to light.  Oral cavity difficult exam secondary to  lack of cooperation but his tongue is coated black because of the  activated charcoal.  NECK:  Without JVD, carotid bruits, lymphadenopathy, or goiter.  Supple.  LYMPHATICS:  No lymphadenopathy.  RESPIRATORY:  Clear to auscultation bilaterally.  CARDIOVASCULAR:  First and 2nd heart sounds are heard.  No 3rd or 4th  heart sounds, murmurs, rubs, gallops, or clicks.  ABDOMEN:  Obese, nontender.  No organomegaly or mass appreciated.  Bowel  sounds are  sluggish but heard.  CENTRAL NERVOUS SYSTEM:  The patient is drowsy but arousable to touch.  He is oriented in person, place and time.  There is no cranial nerve or  focal neurological deficits.  EXTREMITIES:  With no cyanosis, clubbing, or edema.  Peripheral pulses  are symmetrically felt.  SKIN:  Without any rashes.  MUSCULOSKELETAL:  Without any external evidence of injury.   LABORATORY DATA:  Comprehensive metabolic panel remarkable for a sodium  of 134, glucose of 181, BUN of 8, creatinine of 0.93.  AST of 48, ALT of  63, and albumin of 3.4.  CBC is normal with hemoglobin of 16, hematocrit  of 46.  Blood alcohol level of 223.  Serum acetaminophen level less than  10.  Salicylate level less than 4.  Urinalysis negative for features of  urinary tract infection.  Urine drug screen is also negative.   ASSESSMENT/PLAN:  1. Drug overdose, Gabapentin and Clonidine.  We will admit the patient      to the stepdown unit.  We will keep the patient nothing by mouth.      We will hydrate with intravenous fluids.  We will provide as needed      dopamine if his mean arterial blood pressure drops less than 65 and      as needed atropine for bradycardia.  To monitor closely for      hypotension, bradycardia, and drowsiness.  We will also monitor      airway closely and we will have to intubate for airway protection      if the patient is unable to protect his airways or he has dropping      saturations.  I have called and discussed with the Poison Control      who have suggested management as above.  2. Hypotension secondary to the clonidine overdose.  We will hydrate      with intravenous fluids and provide pressors as needed.  3. Questionable attempt at suicide.  We will obtain a psychiatric      consult, when the patient is awake and alert. Will also place one-      on-one sitter.  4. Alcohol dependence and intoxication.  We will provide intravenous      thiamine and folate supplementation.   We will monitor for delirium      tremens. For as needed Ativan or Ativan withdrawal protocol when      the patient is awake and alert.  5. History of tobacco use- for a nicotine patch and discuss cessation      counseling.  6. Psychiatric.  Bipolar disorder, anxiety disorder, mood disorder.      Obtain psychiatric consult when awake and alert.  7. Mild alcoholic hepatitis- to monitor hepatic panel.  8. Altered mental status secondary to alcohol intoxication and his      drug overdose, again to monitor his airway closely and his      hemodynamic status closely.      Marcellus Beck, MD  Electronically Signed     AH/MEDQ  D:  12/24/2006  T:  12/24/2006  Job:  [id number]

## 2010-08-24 NOTE — Consult Note (Signed)
NAME:  [name], [name]               ACCOUNT NO.:  [id number]   MEDICAL RECORD NO.:  [id number]          PATIENT TYPE:  INP   LOCATION:  2910                         FACILITY:  [facility]   PHYSICIAN:  Antonietta Breach, M.D.  DATE OF BIRTH:  [birth year]   DATE OF CONSULTATION:  12/25/2006  DATE OF DISCHARGE:                                 CONSULTATION   REASON FOR CONSULTATION:  Overdose.   HISTORY OF PRESENT ILLNESS:  Mr. [name] is a 45 year old male  admitted to the [facility] on December 24, 2006 due to an overdose  of Clonidine and Gabapentin.   The patient did experience a drop in his blood pressure due to the  overdose.  He states that he ran out of his Neurontin 4 weeks prior to  admission.  He then began to re-use alcohol at the rate of about a half  to a fifth of alcohol per day for 4 weeks.   He developed depressed mood, difficulty concentrating, anhedonia,  insomnia and suicidal thoughts.  He had an increase in his excessive  worry, feeling on edge and muscle tension.   Acute stresses have included very challenging demands on his job as well  as coping with his responsibility with his ex-wife and children.   He does acknowledge that this was a suicide attempt.   Mr. [name] has now regained his orientation.  He has regained his  memory ability.  He is cooperative with bedside care.   PAST PSYCHIATRIC HISTORY:  Mr. [name] does acknowledge two previous  suicide attempts.  He has undergone 3 psychiatric admissions.   He does state that when he was in his 16s he could go for maybe 2 or 3  days at a time not sleeping and drinking the whole time.   He has been tried on a number of psychotropic medications.  He has had 3  psychiatric admissions.   Psychotropic trials include Lamictal, Zoloft, Lexapro, Paxil, Cymbalta  and Seroquel.  He is not sure whether he has been tried on Effexor.   FAMILY PSYCHIATRIC HISTORY:  None known.   SOCIAL HISTORY:  Mr. [name] is  divorced.  He has 4 children, 3 males  who are in adolescent age and a 44 year old male.  He does not do any  illegal drugs.  He works in Airline pilot.  He has to support a girlfriend.   PAST MEDICAL HISTORY:  1. Hypertension  2. Gastroesophageal reflux disease.   MEDICAL DECISION MAKING:  The MAR is reviewed.  The patient is on an  Ativan taper.  He is having some breakthrough withdrawal symptoms.  He  is taking a multivitamin daily.   The patient has a history of an adverse reaction to Trazodone.  He  reports having suicidal dreams and other nightmares with it.   LABORATORY DATA:  WBC 7.3, hemoglobin 16, platelet count 249, sodium  137, potassium 3.6, chloride 105, bicarb 23, BUN 5, creatinine 0.85,  glucose 111, calcium 8.5, SGOT  14, SGPT 57, albumin 3.1, urine drug  screen negative, aspirin negative, alcohol upon presentation to the  emergency room was  223.   REVIEW OF SYSTEMS:  CONSTITUTIONAL:  Afebrile.  No weight loss.  HEAD:  No trauma.  EYES:  No visual changes.  EARS:  No hearing impairment.  NOSE:  No rhinorrhea.  MOUTH/THROAT:  No sore throat.  NEUROLOGIC:  No  focal, motor or sensory deficits.  PSYCHIATRIC:  As above.  CARDIOVASCULAR:  The patient has no chest pain or palpitations.  RESPIRATORY:  No coughing or wheezing.  GASTROINTESTINAL:  No nausea,  vomiting, diarrhea.  GENITOURINARY:  No dysuria.  SKIN:  Unremarkable.  ENDOCRINE/METABOLIC:  No heat or cold intolerance.  MUSCULOSKELETAL:  No  deformities.  HEMATOLOGIC/LYMPHATIC:  Unremarkable.   PHYSICAL EXAMINATION:  VITAL SIGNS:  Temperature 98, pulse 96,  respiratory rate 24, blood pressure 158/ 97.  O2 saturation on 2 liters  100%.  GENERAL: The patient does have a slight tremor on hand extension.  Mr.  [name] is a middle aged male sitting up in his hospital bed with no  abnormal involuntary movements.  He requests that his girlfriend attend  the interview for purposes of education and facilitating social  support.  OTHER MENTAL STATUS EXAM:  Mr. [name] is alert.  His eye contact is  good.  His attention span is mildly decreased.  His concentration is  mildly decreased.  His affect is constricted.  His mood is anxious.  He  is oriented to all spheres.  His memory is intact to immediate, recent  and remote except for the overdose black out.   Fund of knowledge and intelligence are within normal limits.  Speech  involves normal rate and prosody without dysarthria thought process.  He  is logical, coherent, goal directed.  No hallucination episodes or  associations.  Abstraction ability is intact.  Language, expression and  comprehension are intact.  Thought content:  The patient does  acknowledge suicidal attempt.  He has no thoughts of harming others.  He  has no delusions or hallucinations.  His insight is partial.  His  judgment is intact for the need of treatment; however, it is not intact  for self-care.   ASSESSMENT:  Axis I:  293.84 Anxiety disorder not otherwise specified.                Depressive disorder not otherwise specified rule out.    296.80 Bipolar disorder not otherwise specified.                Alcohol dependence with physiologic dependence.  Axis II:  Deferred.  Axis III:  See past medical history above.  Axis IV:  Occupational primary support group.  Axis V:  30.   Mr. [name] is still at risk of harming himself.  He also has  physiologic alcohol dependence.  He would also have difficulty  performing activities of daily living.   The undersigned provided education and ego support.   RECOMMENDATIONS:  1. Would restart the Ativan protocol and monitor for excess sedation      as well as break through withdrawal signs.  Hold Ativan for      sedation.  2. Would consider the sitter for suicide precaution.  3. Would transfer to a psychiatric ward as soon as possible when      medically cleared for dual diagnosis treatment.  4. Regarding the patient's medication  management.  He may have a form      of bipolar disorder, also, since Neurontin has been effective for      anxiety in this patient and given his  potential risks for      dependence of benzodiazepines, the GABA system can be addressed for      a number of reasons.  Neurontin can be effective for some people in      treating anxiety including a small report involving Neurontin in      treating social phobia.  The patient has reported some slight      sedation with Neurontin, therefore, if it is increased the patient      could be treated with 300 mg b.i.d. and 900 mg q.h.s.  Other GABA      agents could be considered.  It is unclear whether the patient does      have a form of bipolar disorder.      Antonietta Breach, M.D.  Electronically Signed     JW/MEDQ  D:  12/25/2006  T:  12/26/2006  Job:  [id number]

## 2010-08-24 NOTE — H&P (Signed)
NAME:  [name], [name]               ACCOUNT NO.:  [id number]   MEDICAL RECORD NO.:  [id number]          PATIENT TYPE:  IPS   LOCATION:  0307                          [facility]ILITY[facility]  [facility]   PHYSICIAN:  Geoffery Lyons, M.D.      DATE OF BIRTH:  15-Jul-1965   DATE OF ADMISSION:  12/29/2006  DATE OF DISCHARGE:                       PSYCHIATRIC ADMISSION ASSESSMENT   IDENTIFYING INFORMATION:  This is a voluntary admission to the services  of Dr. Geoffery Lyons.  This is a 45 year old divorced white male.  The  patient entered the hospital on December 24, 2006.  This was as the  result of an overdose.  He had taken 10-12 tablets of 300 mg gabapentin  and clonidine 0.2 mg, number of tablets unknown.  When he arrived in the  emergency room, on December 24, 2006, he was hypertensive with a blood  pressure of 176/57 and he was somnolent.  He was started on a dopamine  drip and admitted to the cardiac intensive care unit.  He was stabilized  and allowed to go out to the main floor.  He was also noted to have  acute bronchitis and was started on Avelox, Mucinex and albuterol to  treat his bronchitis.  He was seen in consultation on December 25, 2006  by Dr. Jeanie Sewer.  Dr. Jeanie Sewer reports that the patient ran out of his  Neurontin approximately four weeks prior to admission.  He then began to  reuse alcohol at the rate of about a half to a fifth of alcohol per day  for four weeks.  He developed an increase his depressed mood.  He was  having difficulty concentrating, anhedonia, insomnia and suicidal  thoughts.  He had an increase in excessive worry and was feeling on edge  and having muscle tension.  Acute stresses have included very  challenging demands on his job as well as coping with the responsibility  for his ex-wives and children.  He did, however, acknowledge that this  was a serious suicide attempt.   PAST PSYCHIATRIC HISTORY:  This is his fifth [facility] admission.   SOCIAL HISTORY:  He finished  high school in 87.  He has been married  and divorced twice.  He has four sons, ages 31, 72, 60 and 78.  He is  employed as a Tax adviser for [employer].   FAMILY HISTORY:  He denies.   ALCOHOL/DRUG HISTORY:  He has been using alcohol on and off for the past  few years and he does smoke 1-1/2 packs of cigarettes per day.   PRIMARY CARE PHYSICIAN:  His primary care Jawanza Zambito is [facility].  He is  seen in the outpatient clinic by Jorje Guild, [facility].   MEDICAL PROBLEMS:  He has hypertension, alcohol dependence/abuse and  gastroesophageal reflux disease.   MEDICATIONS:  He was transferred over from the main hospital on Avelox  400 mg p.o. daily to complete treatment for bronchitis on January 03, 2007, clonidine 0.2 mg p.o. t.i.d., Mucinex 600 mg b.i.d., labetalol 200  mg p.o. b.i.d., multivitamin 1 daily, nicotine patch 21 mg daily,  thiamine 100 mg  p.o. q.d., Tylenol 650 mg q.4h. p.r.n., albuterol  inhaler 2.5 mg q.6h. and q.2h. p.r.n., guaifenesin 600 mg p.o. b.i.d.  for two weeks and oxycodone 5 mg p.o. q.4h. p.r.n. pain.   ALLERGIES:  He has no known drug allergies.   POSITIVE PHYSICAL FINDINGS:  His physical examination is well-documented  and on the chart.  Vital signs, on admission to our unit, show he is 69  inches tall, he weighs 236 pounds, temperature is 97.6, blood pressure  132/79 to 104/63 and his pulse was 95-99, respirations 28.   MENTAL STATUS EXAM:  Today, he was easily aroused and he is casually  dressed and groomed, adequately nourished.  His speech was not  pressured.  His mood is irritable.  His affect is depressed.  His  thought processes are clear, rational and goal-oriented.  He was quite  knowledgeable about his medications and what he would and would not  take.  Judgment and insight are good.  Concentration and memory are  intact.  Intelligence is at least average.  He denies being suicidal or  homicidal.  He denies auditory and visual hallucinations.    DIAGNOSES:  AXIS I:  Bipolar disorder not otherwise specified.  Anxiety  disorder not otherwise specified.  Alcohol dependence.  AXIS II:  Deferred.  AXIS III:  Hypertension and gastroesophageal reflux disease, also  bronchitis.  AXIS IV:  Problems with primary support group, occupational issues,  economic issues.  AXIS V:  30.   PLAN:  To admit for safety and stabilization.  To start effective  medication.  Toward that end, it was finally decided after much  discussion with Dr. Katrinka Blazing, myself and the patient that he would be  started on an __________ patch 6 mg per day and he will consider  Antabuse as a method to help prevent relapse on alcohol once his anxiety  and depression are stabilized.  He will also have follow-up in the  outpatient clinic with Jorje Guild.  He does have an appointment on the  books for that.   ESTIMATED LENGTH OF STAY:  Three to four days.      Mickie Leonarda Salon, P.A.-C.      Geoffery Lyons, M.D.  Electronically Signed    MD/MEDQ  D:  12/30/2006  T:  12/30/2006  Job:  [id number]

## 2010-08-24 NOTE — H&P (Signed)
NAME:  [name], [name] NO.:  [id number]   MEDICAL RECORD NO.:  [id number]          PATIENT TYPE:  IPS   LOCATION:  0503                          [facility]ACILITY:  [facility]   PHYSICIAN:  Geoffery Lyons, M.D.      DATE OF BIRTH:  [birth year]   DATE OF ADMISSION:  08/31/2006  DATE OF DISCHARGE:                       PSYCHIATRIC ADMISSION ASSESSMENT   DATE OF ASSESSMENT:  Sep 01, 2006 at 9:50 a.m..   IDENTIFYING INFORMATION:  This is a 45 year old white male who is  divorced.  This is a voluntary admission.   HISTORY OF PRESENT ILLNESS:  This is about the eighth admission to [facility], with his last admission being a year ago.  This 45 year old presented requesting help with his mood.  He reports  suffering a panic attack yesterday which he attributes to recent  stressors building up over custody issues with his two teenage sons.  Also has had quite a bit of work pressure.  Feels that his mood is  getting unstable and had some thoughts of possibly wanting to hurt  himself.  Was not sure he could be safe, some vague thoughts of  overdosing.  He has been off his medications for the past two years,  which previously were lithium and Neurontin, and had begun drinking  alcohol about 2 years ago.  More recently, his use has been regular, and  it has escalated to drinking between a half of a fifth and a whole fifth  of alcohol daily.  He denies any homicidal thoughts or hallucinations.   PAST PSYCHIATRIC HISTORY:  This is the patient's eighth admission to  [facility] with his last admission being  February 12-17, 2007.  He was detoxed from alcohol and mood stabilized  and discharged on lithium 450 mg b.i.d., Neurontin 600 mg at bedtime and  300 mg t.i.d. and Campral 666 mg t.i.d., and Toprol 25 mg daily was used  to stabilize his blood pressure.  The patient has a history of prior  suicide attempts, including serious overdose attempt,  resulting in an  extended ICU admission.  He has a history of polysubstance abuse,  including distant history of fairly heavy marijuana use for which he has  been abstinent for more than a year.  He has also used cocaine in the  past, though is not currently using that.  No history of mania.   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  He is in a  6-month relationship with a stable girlfriend who does not drink  alcohol.  He has two teenage sons ages 68 and 45 who are currently in  the primary custody of his ex-wife.  He has no current legal charges.  He reports his recent stressors include his ex-wife requesting him to  change towns of residence and assume a 50/50 custody of the sons.  The  patient did all that, and then the wife refused to follow through on the  plans, leaving him feeling  exasperated and frustrated with her demands.   FAMILY HISTORY:  Noncontributory.   ALCOHOL AND DRUG HISTORY:  As noted above.   MEDICAL HISTORY:  The patient is followed by Dr. Modena Jansky in [place],  his primary care practitioner.  Medical problems are hypertension.   CURRENT MEDICATIONS:  Clonidine 0.2 mg p.o. t.i.d..   DRUG ALLERGIES:  None.   POSITIVE PHYSICAL FINDINGS:  The patient's full physical exam is noted  in the record.  Generally, he is a well-nourished, well-developed,  healthy-appearing white male who has a  significant history of some  hypertension which is currently controlled on the clonidine.  On  presentation, 5 feet 10 inches tall, 221 pounds, temperature 99.2, pulse  98, respirations 18 and blood pressure 147/107.   DIAGNOSTIC STUDIES:  CBC:  WBC 7.0, hemoglobin 15.5, hematocrit 45.4 and  platelets 235,000.  His chemistry was sodium 132, potassium 2.7,  chloride 100, carbon dioxide 24, BUN 9, creatinine 0.97 and random  glucose 119.  Liver enzymes SGOT 41, SGPT 45, alkaline  phosphatase 92  and total bilirubin 0.8.  TSH is currently pending.  Calcium is normal.  Alcohol level was not drawn.  By breathalyzer during assessment, his  alcohol level was zero.   MENTAL STATUS EXAM:  Fully alert male with a blunted and constricted  affect, but good insight.  Eye contact is good, fully engaged in  conversation.  Speech is normal in pace, tone, production.  Fluent,  articulate.  Mood is depressed.  Thought process is logical and  coherent.  He has had some vague suicidal thoughts and fleeting thoughts  of overdose with no intent.  Cognition is well-preserved.  No homicidal  thought.  No hallucinations.  No formication.   DIAGNOSES:  AXIS I:  EtOH abuse, rule out dependence.  Rule out mood  disorder.  AXIS II:  Deferred.  AXIS III:  Hypokalemia, hypertension.  AXIS IV:  Severe issues with job pressures and parenting issues.  AXIS V:  Current 36, past year 37.   PLAN:  To voluntarily admit the patient with q. 15-minute checks in  place with a goal of a safe detox and to alleviate his suicidal  thoughts.  We started him on a Librium protocol to get him fully detoxed  and he is tolerating that well.  That is, Librium 25 mg q.i.d. on the  first day with additional medications to handle withdrawal symptoms.  We  have given him K-Dur 40 mEq on admission for the hypokalemia.  Will give  him two doses today and recheck both a magnesium level and a potassium  level tonight.  Will give him trazodone 100 mg p.o. q.h.s. for the  insomnia.  Will continue his routine clonidine.  Will consider the need  for additional medications and evaluate his mood on an ongoing basis.  He has been fully cooperative and participating in dual diagnosis  programming.  Estimated length of stay is 5 days.      Margaret A. Tayton, N.P.      Geoffery Lyons, M.D.  Electronically Signed   MAS/MEDQ  D:  09/01/2006  T:  09/01/2006  Job:  [id number]

## 2010-08-27 NOTE — Op Note (Signed)
   NAME:  [name], [name]                         ACCOUNT NO.:  [id number]   MEDICAL RECORD NO.:  [id number]                   PATIENT TYPE:  AMB   LOCATION:  ENDO                                 FACILITY:  [facility]   PHYSICIAN:  Charna Elizabeth, M.D.                   DATE OF BIRTH:  Feb 16, 1966   DATE OF PROCEDURE:  11/14/2001  DATE OF DISCHARGE:                                 OPERATIVE REPORT   PROCEDURE:  Colonoscopy.   ENDOSCOPIST:  Charna Elizabeth, M.D.   INSTRUMENT USED:  Olympus video colonoscope.   INDICATIONS FOR PROCEDURE:  Rectal bleeding on two occasions in a 45-year-  old white male with a history of left lower quadrant pain.  Rule out colonic  polyps, masses, hemorrhoids, etc.   PREPROCEDURE PREPARATION:  Informed consent was procured from the patient.  The patient was fasted for eight hours prior to the procedure and prepped  with a bottle of magnesium citrate and a gallon of NuLytely the night prior  to the procedure.   PREPROCEDURE PHYSICAL:  VITAL SIGNS: The patient had stable vital signs.  NECK:  Supple.  LUNGS:  Chest clear to auscultation.  CARDIAC:  S1, S2 regular.  ABDOMEN:  Abdomen is soft with normal bowel sounds.   DESCRIPTION OF PROCEDURE:  The patient was placed in the left lateral  decubitus position, sedated with 100 mg of Demerol and 10 mg of Versed  intravenously.  Once the patient was adequately sedated and maintained on  low-flow oxygen and continuous cardiac monitoring, the Olympus video  colonoscope was advanced in the rectum, cecum, and terminal ileum without  difficulty.  No masses, polyps, erosions, or ulcerations were seen.  There  were a few left-sided diverticula present.  Small internal hemorrhoids were  seen on retroflexion in the rectum.  The terminal ileum appeared healthy  without lesions.   IMPRESSION:  1. Essentially normal colonoscopy up to the terminal ileum except for a few     left-sided diverticula.  2. Small nonbleeding internal  hemorrhoids.    RECOMMENDATIONS:  1. A high-fiber diet has been recommended for the patient.  2. Outpatient followup with further recommendations.                                               Charna Elizabeth, M.D.    JM/MEDQ  D:  11/14/2001  T:  11/18/2001  Job:  [id number]   cc:   Earlene Plater L. Cloward, M.D.  [address]  [place]  [place] [address]  Fax: [phone]
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
## 2010-08-27 NOTE — Discharge Summary (Signed)
NAME:  [name], [name]               ACCOUNT NO.:  [id number]   MEDICAL RECORD NO.:  [id number]          PATIENT TYPE:  INP   LOCATION:  4703                         FACILITY:  [facility]   PHYSICIAN:  Charlaine Dalton. Sherene Sires, MD, FCCPDATE OF BIRTH:  [birth year]66   DATE OF ADMISSION:  05/03/2006  DATE OF DISCHARGE:  05/06/2006                               DISCHARGE SUMMARY   FINAL DIAGNOSES:  1. Severe musculoskeletal chest pain secondary to left posterior rib      fracture.      a.     Status post fall from fence on April 28, 2006.  2. Small left pleural effusion secondary to number one.  3. Hypertension, previously on ACE inhibitors, which may have      aggravated the chest pain due to cough.   HISTORY:  This is a 45 year old white male, active smoker, who was  admitted to [facility] for refractory pain following an episode where  he fell off a fence (he says he dropped about four feet, hit his  back).  He was initially seen at [facility] immediately after this  and then again several days later with refractory left chest pain that  radiated around his left flank and with a negative set of x-rays, as  well as rib detail.  However, when I saw him in the office on May 03, 2006 he could not be comfortable even at rest and even with the use  of narcotic analgesics and I thought the pain was disproportion and  wondered about more serious internal injuries.  I admitted him for  further evaluation and found that indeed he did have a fracture of one  of the inferior left posterior ribs on CT scan corresponding roughly to  the T10 through T12 dermatome where his pain was most severe.  He also  had a very small left pleural effusion, but his lab studies were normal  including CBC and sed rate.   He was seen by the trauma service, which recommended adequate analgesics  and muscle relaxants and consideration for anesthesia to preformed a  nerve block if the pain remained refractory.   On  the day of discharge the patient was finally ambulatory and eating  adequately and therefore felt appropriate for discharge.   Of note, the patient's ACE inhibitors were stopped.  It was felt that  some of his cough may have been related to ACE inhibitor use and the  cough may exacerbated the rib fracture induced chest wall pain.   His blood pressure was well controlled on clonidine at 0.2 mg t.i.d.,  which is felt to be synergistic with these analgesics and ACE inhibitors  were stopped.  His other medications included Mucinex DM two b.i.d.,  Nicoderm 21 mg per 24 hour patch, OxyContin 40 mg b.i.d., Prilosec 20 mg  b.i.d. before meals, Robaxin 1500 mg q.i.d., Senokot S two b.i.d.,  tramadol 50 mg q.6 and Dilaudid 4 mg q.3 p.r.n. excessive pain.   He was asked to return to the office in a week for follow up chest x-ray  to make sure  that the effusion, which probably represents a small  hemothorax, does not progress.     Charlaine Dalton. Sherene Sires, MD, [facility]  Electronically Signed    MBW/MEDQ  D:  05/07/2006  T:  05/08/2006  Job:  [id number]

## 2010-08-27 NOTE — Discharge Summary (Signed)
NAME:  [name], [name] NO.:  [id number]   MEDICAL RECORD NO.:  [id number]          PATIENT TYPE:  IPS   LOCATION:  0306                          [facility]ACILITY[facility]  [facility]   PHYSICIAN:  Jeanice Lim, M.D. DATE OF BIRTH:  [birth year]   DATE OF ADMISSION:  04/26/2004  DATE OF DISCHARGE:  04/30/2004                                 DISCHARGE SUMMARY   IDENTIFYING DATA:  This is a 45 year old married separated, in the process  of divorce, Caucasian male voluntarily admitted.  Referred by  psychotherapist for increasing depression, tearfulness, drinking probably a  fifth a liquor almost every day.  Reporting that his life was out of  control, drinking out of control, feeling very lonely, alone.  Relapsed on  alcohol 3-4 months ago since he started living alone.  Had been living with  a friend of the family and doing fairly well with limited substance use.  Occasionally using cocaine, smoking crack cocaine once every couple of  weeks.  Reported mood had been up and down with episodes of being very  depressed and tearful and off medications several months ago.  Not sure if  they were helping him.  Had been separated from his wife for several months  and was previously living with parents and then with a friend of the family.  Now living alone.  Caring for four sons, regarding custody, one night a week  and almost every other weekend.  Endorsed suicidal thoughts, wanting to  drive into something.  Presented very tearful with labile mood.   PAST PSYCHIATRIC HISTORY:  This is the patient's sixth admission to  [facility] since 2001.  Last was in March of 2005.  History of  multiple suicide attempts, quite serious, resulting in extended ICU  treatment.   SUBSTANCE ABUSE HISTORY:  Past history with alcohol and cocaine use.   The patient, in the past, has been treated with Lamictal, Zyprexa, Seroquel,  Effexor, Cymbalta, all at various times, tolerating them fairly well  but not  clearly responding.  However, due to substance abuse, there likely had not  been a long enough period of abstinence during these medication trials.   MEDICAL HISTORY:  The patient does have a history of hypertension, fractured  left third toe when intoxicated and on a platform shoe, having some pain.  Also history of kidney stones, peptic ulcer.  No history of seizures but  positive tremors and other withdrawal symptoms, which were quite severe  including confusion.   MEDICATIONS:  Takes Nexium for GERD.  Again had stopped all psychiatric  medications.   ALLERGIES:  No known drug allergies.   PHYSICAL EXAMINATION:  Physical exam within normal limits.  Neurologically  nonfocal.   LABORATORY DATA:  Routine admission labs essentially within normal limits.   MENTAL STATUS EXAM:  The patient was fully alert, cooperative, labile,  tearful.  Poor eye contact.  Described history of auditory hallucinations  but did not appear internally distracted.  Had decreased amount of speech,  although at times pressured.  Mood was depressed, labile, guarded.  Thought  processes logical.  No flight of ideas.  Some psychomotor agitation with  suicidal thoughts of either overdosing or driving delivery truck into  various objects or into traffic, running light.  Quite angry, labile,  tearful and agitated, possibly craving.  Cognitively intact.  Judgment and  insight impaired with history of poor impulse control.  Intelligence within  normal range.   ADMISSION DIAGNOSES:   AXIS I:  1.  Bipolar disorder, mixed-state with superimposed substance-induced mood      disorder.  2.  Alcohol dependence.  3.  Cocaine abuse.  4.  Polysubstance abuse.   AXIS II:  Deferred.   AXIS III:  None.   AXIS IV:  Severe (social isolation, stress, being drawn through some custody  issues with first wife and in the divorce process with second wife and not  doing well living alone, especially regarding the  substance use).   AXIS V:  20/65.   HOSPITAL COURSE:  The patient was admitted and ordered routine p.r.n.  medications and underwent further monitoring.  Was encouraged to participate  in individual, group and milieu therapy.  Was placed on safety checks.  Worked on a relapse prevention plan as well as placed on low-dose Librium  detox protocol which he tolerated fairly well.  He was started on lithium  due to history of clear mood instability and Risperdal after risks/benefit  ratio and alternative treatments were discussed.  The patient had been tried  on multiple other mood stabilizers and multiple high dose anti-psychotics as  tranquilizers, which patient required acutely at some point, then would  become somewhat sedated and depressed and this appeared to be the way he  would struggle with most, often then stopping his medications, increasing  substance use and then ending up back in the hospital.  Therefore, caution  with degree of polypharmacy and high doses that he had been on in the past  were taken into consideration when designing his psychopharmacologic  treatment plan.  The patient reported positive tolerance to medication  adjustments, reported stabilizing of mood, improved sleep, improved outlook  on future, improved frustration tolerance and short-term memory and  concentration.   CONDITION ON DISCHARGE:  The patient was discharged with no acute withdrawal  symptoms, no dangerous ideation, no psychotic symptoms, improved judgment  and insight and coping skills as well as high motivation to remain abstinent  and seek therapy, follow through a relapse prevention plan as well as  medication monitoring.  The patient was discharged after repeated medication  education.   DISCHARGE MEDICATIONS:  1.  Neurontin 300 mg q.6h. p.r.n. anxiety.  2.  Protonix 40 mg q.a.m.  3.  Eskalith CR 450 mg b.i.d. 4.  Seroquel 100 mg, 1/2-2 at 9 p.m.  5.  Risperdal 0.5 mg at 9 p.m.  6.   Zyprexa 5 mg q.8h. p.r.n. severe agitation.   The patient was discharged, again, with a more stable mood, more hopeful, no  risk issues.   DISCHARGE DIAGNOSES:   AXIS I:  1.  Bipolar disorder, mixed-state with superimposed substance-induced mood      disorder.  2.  Alcohol dependence.  3.  Cocaine abuse.  4.  Polysubstance abuse.   AXIS II:  Deferred.   AXIS III:  None.   AXIS IV:  Severe (social isolation, stress, being drawn through some custody  issues with first wife and in the divorce process with second wife and not  doing well living alone, especially regarding the substance use).   AXIS V:  Global Assessment of Functioning on discharge 55-60.   FOLLOW UP:  The patient was discharged to follow up with Dr. Kathrynn Running and  with Dr. Arita Miss for therapy.      JEM/MEDQ  D:  06/03/2004  T:  06/04/2004  Job:  [id number]

## 2010-08-27 NOTE — Discharge Summary (Signed)
   NAME:  STATON, MARKEY                      ACCOUNT NO.:  [id number]   MEDICAL RECORD NO.:  [id number]                   PATIENT TYPE:  INP   LOCATION:  0366                                 [facility]ACILITY[facility]   PHYSICIAN:  Nanetta Batty, M.D.                DATE OF BIRTH:  07/22/1965   DATE OF ADMISSION:  08/29/2002  DATE OF DISCHARGE:  08/30/2002                                 DISCHARGE SUMMARY   DISCHARGE DIAGNOSES:  1. Chest pain, consistent with unstable angina, normal coronaries at     catheterization.  2. Gastroesophageal reflux.  3. History of smoking.   HOSPITAL COURSE:  The patient is a 45 year old male admitted to [facility]  emergency room with substernal chest pain described as tightness with  radiation to his left arm and associated with shortness of breath and  diaphoresis.  The symptoms came on after he had taken a break during a  custody hearing in his divorce case.  On admission to [facility] emergency  room he admitted to similar episodes while at work over the past year  relieved with rest.  His EKG showed sinus rhythm with question of inferior  Q's.  Initial labs were within normal limits.  The patient was admitted to  telemetry and ruled out for an MI.  He was set up for diagnostic  catheterization, which was done 08/30/02 by Dr. Allyson Sabal, which revealed normal  coronaries and normal LV function.  He tolerated this well.  He is to be  discharged later today.   DISCHARGE MEDICATIONS:  Nexium 40 mg b.i.d. for one week, then back to once  a day.   LABORATORY DATA:  White count 8.7, hemoglobin 17.4, hematocrit 52, platelets  271.  INR 0.8.  Sodium 138, potassium 3.6, BUN 10, creatinine 1.0.  Liver  functions were normal.  CK-MB and troponins were negative x3.   Chest x-ray showed no active disease.  EKG showed sinus rhythm without acute  changes.   DISPOSITION:  Patient discharged in stable condition.  He will follow up in  our office in about two weeks  for a groin check.      Abelino Derrick, P.A.                      Nanetta Batty, M.D.    Lenard Lance  D:  08/30/2002  T:  08/30/2002  Job:  [id number]

## 2010-08-27 NOTE — H&P (Signed)
NAME:  [name], [name]               ACCOUNT NO.:  [id number]   MEDICAL RECORD NO.:  [id number]          PATIENT TYPE:  INP   LOCATION:  4703                         FACILITY:  [facility]   PHYSICIAN:  Charlaine Dalton. Sherene Sires, MD, FCCPDATE OF BIRTH:  January 05, 1966   DATE OF ADMISSION:  05/03/2006  DATE OF DISCHARGE:                              HISTORY & PHYSICAL   CHIEF COMPLAINT:  Chest pain.   HISTORY:  A 45 year old white male active smoker with a history of  hypertension on ACE inhibitors with a chronic smoker's cough, dating  back years, worse in the winter time.  Now with severe chest pain made  worse by coughing ever since he fell off a fence on April 28, 2006.  He was initially seen in the emergency room with a negative CT scan of  the chest and discharged on Vicodin.  However, the pain gradually  worsened and was seen on May 02, 2006, for a followup chest x-ray  showing minimal right basal atelectasis, but a CT scan was not repeated.  Rib detail on left showed no evidence of fracture.  However, the  patient, despite taking Dilaudid up to 4 mg every 3 hours, cannot get  relief from the pain.  He is having waves of pain that occur whenever he  moves in any position besides sitting bolt upright and is not able to  eat or drink at this point.  He came to the office in a wheelchair and  was not even really able to allow me to examine him in detail in a  wheelchair position without wincing in pain.  At this point, I feel he  has failed outpatient management and needs further evaluation for blunt  trauma to the chest, which would include repeat CT scan of the chest and  abdomen to rule out hemothorax, lung contusion, and splenic injury.   The patient denied any fever, nausea, or vomiting, but has just not  felt like eating or drinking anything.  He denies any change in bowel  or bladder habits.   PAST MEDICAL HISTORY:  Significant for hypertension and reflux.   ALLERGIES:  NONE  KNOWN.   MEDICATIONS:  Include Prilosec and lisinopril unknown dose one daily.   SOCIAL HISTORY:  He continues to smoke two packs per day.  He denies any  alcohol use.   FAMILY HISTORY:  Significant for his father having lung cancer and was a  smoker.   REVIEW OF SYSTEMS:  Taken in detail and significant only for the  problems as outlined above.   PHYSICAL EXAMINATION:  GENERAL:  This is a very uncomfortable appearing  white male who sits somewhat slumped over in a wheelchair and even when  he tries to straighten for an exam or take his shirt off, begins having  severe paroxysms of pain with obvious muscle spasm involving his left  chest wall and abdomen and really could not be examined in any further  detail by palpation.  However, there was no obvious hematoma.  VITAL SIGNS:  He was afebrile with normal vital signs, except for a  blood  pressure of 160/104 and a pulse rate of 125.  HEENT:  Unremarkable.  Oropharynx clear.  LUNGS:  Lung fields were clear, although breath sounds were diminished  in the bases.  There is no obvious rub.  There was no obvious hematoma  and there was a regular rate and rhythm without murmur, gallop, or rub  present.  ABDOMEN:  Soft, but could not examine him adequately in the upright  position.  EXTREMITIES:  Warm without calf tenderness, cyanosis, clubbing, or  edema.   X-rays were evaluated as above.   IMPRESSION:  1. Severe chest contusion with secondary muscle spasm.  He has already      been tried on outpatient medications, including Dilaudid up to 4 mg      every 3 hours and is uncomfortable in any position, besides sitting      perfectly upright in a wheelchair.  He is also no longer able to      eat or drink because of the severity of the pain.  Even moving a      muscle causes waves of pain.   I am concerned about hematomas, hemothorax, and also splenic injury and  I recommended that he be admitted to the hospital for a CT scan of the   chest and abdomen tonight, as well as repeat CBC.   1. Hypertension.  Although he has been able to tolerate a chronic      cough previously, ACE inhibitors need to be stopped now and      cigarettes obviously need to be discontinued to see if we can      eliminate the cough, which I believe is adding to the muscle spasm      of the chest wall.  Clonidine should be a good bridge to      alternative treatment for his hypertension with the avoidance of      ACE in this setting.      Charlaine Dalton. Sherene Sires, MD, [facility]  Electronically Signed     MBW/MEDQ  D:  05/03/2006  T:  05/04/2006  Job:  [id number]

## 2010-08-27 NOTE — H&P (Signed)
NAME:  [name], [name]                         ACCOUNT NO.:  [id number]   MEDICAL RECORD NO.:  [id number]                   PATIENT TYPE:  IPS   LOCATION:  0501                                 FACILITY:  [facility]   PHYSICIAN:  Geoffery Lyons, M.D.                   DATE OF BIRTH:  1965/08/28   DATE OF ADMISSION:  05/12/2003  DATE OF DISCHARGE:                                HISTORY & PHYSICAL   IDENTIFYING INFORMATION:  A 45 year old married white male, voluntarily  admitted on May 12, 2003   HISTORY OF PRESENT ILLNESS:  The patient presents with a history of  depression, suicidal ideation.  The patient states he sees no point in going  on.  He feels very hopeless and helpless.  He feels as if he is in an ocean.  He sees nothing in sight.  His sleep has been decreased.  He felt unable  to contract for safety when the patient was admitted.  He has been drinking  daily about 2-3 beers after the patient was subpoenaed for court appearance  yesterday.  He states his ex-wife is trying to get more money from him.  He  feels paranoid, stating that everyone has an angle.  He states he has lost  20-30 pounds.  He cannot say he will be safe while he is here and he will  not say if he is having any homicidal thoughts towards his ex-wife.   PAST PSYCHIATRIC HISTORY:  Second hospitalization, the patient was here in  2001 for depression and suicide attempt.  He did not say what that was.  He  sees Reynold Bowen on an outpatient basis and has had no other psychiatric  admissions.   SOCIAL HISTORY:  He is a 45 year old married white male, married for 4  years, second marriage.  Has 4 children ages 35, 58, 34 and 43.  He lives with  his wife and children.  He has lost custody, he states, because his current  wife was charged with harm to the youngest child.  He states he can see the  children with supervision.  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   FAMILY HISTORY:  Unclear.   ALCOHOL DRUG  HISTORY:  The patient smokes.  He began drinking about 2-3  beers over the past week daily.  He has a history of alcohol abuse.  He has  been sober for 3 years, denies any drug use.   PAST MEDICAL HISTORY:  Primary care Kai Railsback is none.  Medical problems are  none.   MEDICATIONS:  Has been on Nexium for some reflux.   DRUG ALLERGIES:  No known allergies.   PHYSICAL EXAMINATION:  Was performed.  This is a well-nourished, well-  developed male without any significant physical findings.  Nonfocal  neurological findings, no tics or tremors.  Temperature 97.6, blood pressure  is 133/70.  He is  5 feet 9 inches tall.   LABORATORY DATA:  CBC within normal limits.  CMET within normal limits.  Urine drug screen is positive for benzodiazepines.   MENTAL STATUS EXAM:  He is an alert, somewhat cooperative male, no eye  contact.  The patient is turned away from the interviewers.  Speech is  clear.  The patient is angry and feeling hopeless.  Affect is flat.  The  patient did have a moment where he hit his fist on the table when speaking  about his children and the custody battles that he has been experiencing.  Thought processes are hopeless and helpless, continuing to support some  suicidal thoughts and homicidal thoughts, although denies any specific  plans.  Cognitive function intact.  Memory is fair, judgment and insight  poor.   ADMISSION DIAGNOSES:   AXIS I:  Adjustment disorder rule out alcohol abuse.   AXIS II:  Deferred.   AXIS III:  Acid reflux.   AXIS IV:  Problems with primary support group, economics, legal system,  other psychosocial problems.   AXIS V:  Current is 20, past year 60-65.   PLAN:  Voluntary admission for suicidal or homicidal ideation.  Contract for  safety, check every 15 minutes.  The patient will be placed on a one to one  as he was unable to contract for safety on admission.  Stabilize.  The  patient is to attend groups.  Will have Zyprexa for anger and  irritability,  will add an antidepressant.  Will consider a family session with current  wife.  The patient is to follow up with mental health and be medication  compliant and to refrain from alcohol use.   TENTATIVE LENGTH OF CARE:  3-5 days.     Landry Corporal, N.P.                       Geoffery Lyons, M.D.    JO/MEDQ  D:  05/14/2003  T:  05/14/2003  Job:  [id number]

## 2010-08-27 NOTE — Discharge Summary (Signed)
NAME:  [name], [name]                         ACCOUNT NO.:  [id number]   MEDICAL RECORD NO.:  [id number]                   PATIENT TYPE:  INP   LOCATION:  0157                                 FACILITY:  [facility]   PHYSICIAN:  Corinna L. Lendell Caprice, MD             DATE OF BIRTH:  [birth year]   DATE OF ADMISSION:  06/29/2003  DATE OF DISCHARGE:  07/01/2003                                 DISCHARGE SUMMARY   DIAGNOSES:  1. Status post suicide attempt with multiple medications.  2. Respiratory failure secondary to above.  3. Depression.  4. Tobacco abuse.   The patient is transferred to inpatient psychiatry.   MEDICATIONS:  Per psychiatrist.   CONDITION ON TRANSFER:  Stable.   CONSULTATIONS:  1. Dr. Jeanie Sewer.  2. Pulmonary critical care medicine.   </PROCEDURES>  1. Intubation.  2. Mechanical ventilation.   DIET:  As tolerated.   ACTIVITY:  Ad lib.   HISTORY AND HOSPITAL COURSE:  Mr. [name] is a 45 year old white male who  overdosed on multiple medications, including loxapine, Zyprexa, and another  unknown medication.  He had been having some martial problems and called his  brother after taking the pills.  Initially, the patient was able to answer  questions in the emergency room, but  became more obtunded and had  respiratory acidosis.  He therefore had to be intubated, and therefore  pulmonary critical care was consulted.  The patient was able to be quickly  extubated after he woke up a bit.  His ABG prior to intubation revealed a pH  of 7.275, PCO2 was 59, PO2 was 115 on non-rebreather mask.  His CBC was  unremarkable.  His complete metabolic panel was significant for a potassium  of 3.1, otherwise unremarkable.  CPK was 645.  This was repeated and  normalized.  His TSH was 1.022.  Acetaminophen level less than 10.  Salicylate level less than 4.  UA negative.  Urine drug screen positive for  benzodiazepines and cocaine.  EKG showed normal sinus rhythm with posterior  infarct, age indeterminate.  After the patient was monitored,  extubated, and medically clear, he was transferred to inpatient psychiatry.  He was confronted about the positive cocaine in his urine, but he adamant  denied using cocaine, and stated that some friends were smoking crack in his  presence.                                               Corinna L. Lendell Caprice, MD    CLS/MEDQ  D:  07/20/2003  T:  07/20/2003  Job:  [id number]

## 2010-08-27 NOTE — Discharge Summary (Signed)
NAME:  [name], [name]                         ACCOUNT NO.:  [id number]   MEDICAL RECORD NO.:  [id number]                   PATIENT TYPE:  INP   LOCATION:  0157                                 FACILITY:  [facility]   PHYSICIAN:  Jeanice Lim, M.D.              DATE OF BIRTH:  06/09/1965   DATE OF ADMISSION:  06/29/2003  DATE OF DISCHARGE:  07/01/2003                                 DISCHARGE SUMMARY   IDENTIFYING DATA:  This is a 45 year old married Caucasian male voluntarily  admitted.  Presented with a history of depression, suicidal ideation,  feeling hopeless and helpless, like he was in motion with nothing in sight.  Very irritable.  Having been drinking daily, 2-3 beers, because he was  worried about court issues and had a great deal of anger towards ex-wife and  reported positive suicidal ideation with multiple plans.   MEDICATIONS:  Nexium.  The patient previously had been at [facility] and on multiple medications, which were abruptly discontinued when  the patient appeared to be overmedicated and the wife threw out the  medicines.  She was supportive.  However, she felt he might be self-  medicating or was on too many medications.  The patient did fairly well on  no medications for a period of time until stressors returned, mostly  involving ex-wife.   ALLERGIES:  No known drug allergies.   PHYSICAL EXAMINATION:  Within normal limits.  Neurologically nonfocal.   LABORATORY DATA:  Routine admission labs within normal limits.   MENTAL STATUS EXAM:  Alert, somewhat cooperative male.  Irritable.  Angry.  Speech was clear.  Affect very flat.  Hopeless, helpless.  Thought process  positive suicidal ideation and episodic homicidal ideation generalized.  Cognitively intact.  Judgment and insight poor.  The patient had some  suspiciousness as well but no hallucinations.   ADMISSION DIAGNOSES:   AXIS I:  1. Bipolar disorder, type 2, mixed phase.  2. History of  possibly self-medicating, not responding to medications and     medications being increased until he had problems from this.   AXIS II:  Deferred.   AXIS III:  Acid reflux.   AXIS IV:  Moderate (problems with primary support group, economic problems,  problems related to legal system and other psychosocial issues revolving his  ex-wife.   AXIS V:  25/55.   HOSPITAL COURSE:  The patient was admitted and ordered routine p.r.n.  medications and underwent further monitoring.  Was encouraged to participate  in individual, group and milieu therapy.  The patient was monitored for  safety as he was restabilized on medications, monitored for withdrawal  symptoms.  He was ordered Librium, Lexapro, Trileptal, Risperdal initially  and then change to Seroquel for suspiciousness and to restore sleep and to  stabilize mood.  The patient was still very angry but not suicidal.  Quite  anxious.  Was seen by Dr.  Ladona Ridgel.  No dangerous ideation and was discharged  in improved condition.  However, he was quite anxious and Klonopin was  started.  Medication education was given.   DISCHARGE MEDICATIONS:  1. Nexium 40 mg daily.  2. Seroquel 100 mg q.h.s.  3. Lexapro 10 mg daily.  4. Trileptal 150 mg b.i.d., 300 mg q.h.s.  5. Klonopin 0.5 mg t.i.d. p.r.n.   FOLLOW UP:  The patient was to continue follow-up with [facility] and  couple's therapy.  He was to follow up with IOP for two weeks and then was  followed up with his outpatient psychiatrist.   DISCHARGE DIAGNOSES:   AXIS I:  1. Bipolar disorder, type 2, mixed phase.  2. History of possibly self-medicating, not responding to medications and     medications being increased until he had problems from this.   AXIS II:  Deferred.   AXIS III:  Acid reflux.   AXIS IV:  Moderate (problems with primary support group, economic problems,  problems related to legal system and other psychosocial issues revolving his  ex-wife.   AXIS V:  Global  Assessment of Functioning on discharge 55.                                               Jeanice Lim, M.D.    JEM/MEDQ  D:  06/25/2003  T:  06/27/2003  Job:  [id number]

## 2010-08-27 NOTE — Discharge Summary (Signed)
NAME:  [name], BREON NO.:  [id number]   MEDICAL RECORD NO.:  [id number]          PATIENT TYPE:  IPS   LOCATION:  0503                          FACILITY:  [facility]   PHYSICIAN:  Geoffery Lyons, M.D.      DATE OF BIRTH:  11-23-1965   DATE OF ADMISSION:  08/31/2006  DATE OF DISCHARGE:  09/07/2006                               DISCHARGE SUMMARY   CHIEF COMPLAINT AND PRESENT ILLNESS:  This was the eighth admission to  [facility] for this 45 year old white male who is  divorced.  Last admission a year prior to this admission.  Requesting  help with his mood.  Endorsed having panic attacks, issues with custody  over his two teenage sons, also endorsed pressure from work, feeling  that his mood was unstable, fighting thoughts of wanting to hurt  himself.  Had began drinking about two years ago.  More recently, his  use has been regular, escalating to drinking between a half of a fifth  and a whole fifth of alcohol daily.   PAST PSYCHIATRIC HISTORY:  Eighth admission to [facility].  Last time May 23, 2005 to May 28, 2005.  At that  time, he was detoxed from alcohol and placed on mood stabilizers.  Placed on lithium 450 mg twice a day, Neurontin 300 mg three times a day  and 600 mg at bedtime and Campral 666 mg three times a day and Toprol 25  mg for his blood pressure.   ALCOHOL/DRUG HISTORY:  As already stated, persistent use of alcohol with  escalating use between a half of a fifth to a whole fifth of alcohol  daily.   MEDICAL HISTORY:  Arterial hypertension.   MEDICATIONS:  Clonidine 0.2 mg three times a day.   PHYSICAL EXAMINATION:  Performed and failed to show any acute findings.   LABORATORY DATA:  CBC revealed white blood cells 7.0, hemoglobin 15.5.  Sodium 132, potassium 2.7, BUN 9, creatinine 0.97, glucose 119.  SGOT  41, SGPT 45, bilirubin 0.8.   MENTAL STATUS EXAM:  Fully alert, cooperative male.  Mood  depressed,  anxious.  Affect constricted.  Thought processes logical, coherent and  relevant.  Not as spontaneous but, when he starts talking, he is fluent,  articulate.  Affect constricted.  Thought processes logical, coherent  and relevant.  Some vague suicidal thoughts.  Can contract for safety.  Overwhelmed with everything that is going on in his life, wanting to  abstain from drinking, wanting to get his mood stable.   ADMISSION DIAGNOSES:  AXIS I:  Alcohol dependence.  Mood disorder not  otherwise specified.  AXIS II:  No diagnosis.  AXIS III:  Hypokalemia, hypertension.  AXIS IV:  Moderate.  AXIS V:  GAF upon admission 36; highest GAF in the last year 75.   HOSPITAL COURSE:  He was admitted.  He was detoxed with Librium.  He was  given some trazodone for sleep.  Given Prilosec 20 mg per day.  Maintained on clonidine 0.2 mg three times a day.  Potassium was  replaced.  He was placed back on Neurontin.  He did endorse increased  use of alcohol, from half a fifth to a fifth a day.  Endorsed  depression, anxiety, increased stress, issues with the ex-wife and  custody of two of his children.  She had said that she was going to give  him the children back and then she changed her mind and apparently has  been going on for a long time.  Had been off his medication of  Neurontin, lithium.  Living with a girlfriend, working at the same job  for the last 13 years.  We continued to work on the detox.  He was  placed back on the Neurontin.  There was some swelling in both his legs.  He then turned to be more somatically focused, worried because he was  told that his swelling could be secondary to heart condition, worried  about the swelling of his legs.  EKG was within normal limits.  He was  given some Vicodin for the pain.  Had bilateral Doppler study.  There  was no indication of any thrombi.  We got concerned that the swelling  could be secondary to the Neurontin.  By Sep 06, 2006,  still having  difficulty with anxiety.  Again, continued to endorse that he did better  with the Neurontin.  We held the Neurontin due to the swelling but he  did respond to some Lasix and that was given on a trial basis so we  resumed his Neurontin as felt that it was really very helpful for him.  Had a session with the girlfriend.  He endorsed that he has been more  honest than he has ever been.  She seemed to be supportive.  On Sep 07, 2006, he was in full contact with reality.  Objectively better.  The  swelling had gone down.  No active withdrawal.  Endorsed no active  suicidal or homicidal ideation.  He was willing and motivated to pursue  outpatient treatment for what we went ahead and discharged to outpatient  follow-up.   DISCHARGE DIAGNOSES:  AXIS I: Alcohol dependence.  Mood disorder not  otherwise specified.  Anxiety disorder not otherwise specified.  AXIS II:  No diagnosis.  AXIS III:  Hypokalemia, corrected, hypertension, swelling of his legs,  resolved.  AXIS IV:  Moderate.  AXIS V:  GAF upon discharge 60.   DISCHARGE MEDICATIONS:  1. Lasix 40 mg, 1 daily as needed.  2. Protonix 40 mg per day.  3. Clonidine 0.2 mg three times a day.  4. Trazodone 100 mg at bedtime.  5. Neurontin 300 mg four times a day.  6. Aspirin 81 mg per day.   FOLLOWUP:  Jorje Guild and __________.      Geoffery Lyons, M.D.  Electronically Signed     IL/MEDQ  D:  09/26/2006  T:  09/27/2006  Job:  [id number]

## 2010-08-27 NOTE — H&P (Signed)
[facility]  Patient:    [name]                       MRN: [id number] Adm. Date:  [id number] Attending:  Marlyn Corporal Fabmy Dictator:   Johnella Moloney, N.P.                         History and Physical  DATE OF ADMISSION:  November 09, 1999  IDENTIFYING INFORMATION:  Mr. Hymon is a 45 year old white married male admitted November 09, 1999, on a voluntary basis with depression and a plan to get a gun or to overdose.  HISTORY OF PRESENT ILLNESS:  The patient reports that he was suicidal yesterday.  He apparently went to court yesterday and took out a restraining order on his ex-wife.  Apparently, the ex-wife has been harrassing him and his wife.  He felt that a burden would be lifted, but he continues to feel very depressed.  He is worried about his wife, who is at home.  He states the depression has been going on for the last few weeks.  "Ive thought about suicide for years.  Ive never really considered doing it until now."  He currently denies suicidal ideation.  He denies hallucinations.  Apparently, he was seen by the ACT team on October 28, 1999, due to acute onset of paranoia that was felt might be related to Celexa that was given to him by a medical doctor. At the time, the patient thought his wife was going to harm him and take his kids.  He apparently sleeps most of the day and all night.  He has no energy. He feels worthless.  He is afraid to get happy because he is afraid he will crash again.  He said he is really not afraid of anyone other than his ex-wife, who harrasses him, who ran around on him, and who does not treat him very well.  Apparently, he and his ex-wife were going through primary custody of their four children on November 23, 1998.  He had a court date yesterday. His wife has the kids for seven days, and then he has them for seven days, then there will be a court hearing on the custody.  He feels like the only reason his wife  wants to get their four children is for him to pay child support.  Also, the current ex-wife took out a warrant on his current wife complaining of harassing telephone calls.  The patient reports that she is not telling the truth, i.e., his ex-wife, and making a big deal out of nothing.  PAST PSYCHIATRIC HISTORY:  The patient has had no previous inpatient or outpatient psychiatric treatment; however, he did tell me he saw a Dr. Jacinta Shoe previously.  He also has an adult counselor, Genevie Ann, whom he had seen one month ago for anger management and severe stress.  PAST MEDICAL HISTORY:  The patient does not have a primary care doctor.  He goes to [facility] for any medical problems.  Medical problems include: 1. History of an ulcer. 2. History of kidney stones. 3. Pain in his right knee from an old injury.  MEDICATIONS: 1. Pepcid AC. 2. Klonopin 0.5 mg b.i.d.  DRUG ALLERGIES:  No known drug allergies, however, he apparently took Celexa and became paranoid after taking it, with some delusions.  He definitely does not want to be on Celexa.  SOCIAL HISTORY:  The patient was married for the first time for 11 years.  He separated in 1999 and divorced his first wife either in late 1999 or early 2000.  They have four children ages 63, 23, 5, and 2.  He has been married for the second time since September 11, 1999.  Apparently, the main stressor is that his ex-wife is creating havoc and trying to obtain custody of their four children. Apparently, his ex-wife also has two children from a previous marriage.  The patient states that he has completed high school.  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  He finds his job very stressful.  He has three brothers, one sister. Father died in 67.  His mother is living.  FAMILY HISTORY:  Father had a head injury.  ALCOHOL AND DRUG HISTORY:  The patient drinks alcohol maybe once a month.  He states he did some excessive drinking in 1999, due to  the separation from his first wife.  He has smoked marijuana in the past.  He had two joints last week.  He states currently he is smoking three packs of cigarettes a day due to stress.  PHYSICAL EXAMINATION:  Vital signs:  Temperature 98.8, pulse 80, respirations 16, blood pressure 138/93.  Weight 182 pounds, height 5 feet 8 inches.  REVIEW OF SYSTEMS:  CARDIOVASCULAR:  Denies any problems.  No hypertension, no chest pain.  LUNGS:  Denies any problems.  Does smoke three packs of cigarettes a day.  No COPD, no recent upper respiratory infection, no cough. NEUROLOGIC:  Denies any problems.  Denies headaches, seizures, or history of falling.  HEMATOLOGY:  No problems.  No anemia, no bleeding disorder. ENDOCRINE:  No problems.  No thyroid problems, no diabetic problems. GASTROINTESTINAL:  He states he has had an ulcer in the past, and he does take Pepcid for this.  No diarrhea, no constipation.  GENITOURINARY:  The patient does have a history of kidney stones, otherwise negative.  MUSCULOSKELETAL: Denies any problems.  EARS, NOSE, THROAT:  Denies any problems.  PREVENTATIVE CARE:  It has been years since he has seen a dentist, and he does not routinely receive medical care.  SKIN, MUCOSA:  No problems.  No rash, ulcer, ecchymosis, or erythema.  PAIN:  States he does have some pain sometimes in his right knee from an old injury, and it is chronic pain.  SLEEP:  He sleeps pretty much all the time recently.  NUTRITION:  Denies any problems; however, there has been a decrease in his appetite.  MENTAL STATUS EXAMINATION:  Appearance is a young adult male who is casually dressed.  He is cooperative.  Speech normal and relevant.  Mood sad and tearful.  Affect depressed.  Passive suicidal ideation.  Thought processes are logical and coherent without evidence of psychosis.  Cognitive:  He is alert and oriented, cognitive function is intact.  ADMISSION DIAGNOSES: Axis I:    Major depression,  single episode, with suicidal ideation. Axis II:   Deferred. Axis III:  1. History of ulcer.            2. History of kidney stones.  Axis IV:   Severe, related to problems with primary support group and            custody battle with ex-wife. Axis V:    Current global assessment of functioning 35, highest in past year            is 65.  CURRENT TREATMENT PLAN AND RECOMMENDATIONS:  Voluntary admission to [facility].  Will maintain his safety.  Check patient every 15 minutes.  Have patient contract for safety.  The patient is really complaining of oversleeping and, since he had a reaction to the Celexa, will switch him to Wellbutrin 100 mg SR q.d. p.o.  He states he has mood swings.  I guess we need to consider maybe a mood stabilizer if the Wellbutrin does not seem to help him.  Klonopin 0.5 mg p.o. b.i.d.  Seroquel 25 mg p.o. q.4-6h. p.r.n. anxiety. Ambien 10 mg p.o. q.h.s.  Pepcid 20 mg p.o. b.i.d.  Will ask [facility] to fax the physical exam done on October 28, 1999, to learn more about his reaction to Celexa.  TENTATIVE LENGTH OF STAY AND DISCHARGE PLAN:  Three days. DD:  11/10/99 TD:  11/12/99 Job: [id number]

## 2010-08-27 NOTE — Discharge Summary (Signed)
NAME:  [name], [name]                         ACCOUNT NO.:  [id number]   MEDICAL RECORD NO.:  [id number]                   PATIENT TYPE:  IPS   LOCATION:  0301                                 FACILITY:  [facility]   PHYSICIAN:  Jeanice Lim, M.D.              DATE OF BIRTH:  1965/10/11   DATE OF ADMISSION:  07/01/2003  DATE OF DISCHARGE:  07/09/2003                                 DISCHARGE SUMMARY   IDENTIFYING DATA:  This is a 45 year old Caucasian male, presenting to the  medicine service after taking an intentional overdose of prescription  medications.  The patient  had gotten angry, left his wife, had been using  cocaine and alcohol and broke into a car to get his medications which were  being monitored, eventually overdosing on his medicines, arguing with his  wife prior to the overdose, had been out late, and required intubation.  Drug screen positive for cocaine.  The patient had been remarried after 5  years. 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  Still on switch from disability, child support proceedings are ahead  of him.  Negative cardiac catheterization in May of 2004.   ADMISSION MEDICATIONS:  Klonopin, Lexapro, Zyprexa and Lamictal.   ALLERGIES:  No known drug allergies.   PHYSICAL EXAMINATION:  Essentially within normal limits, neurologically  nonfocal.   ROUTINE ADMISSION LABS:  Within normal limits.   MENTAL STATUS EXAM:  Fully alert, poor eye contact, limited affect.  Discussing the use of time.  Speech normal.  Mood depressed, somewhat  indifferent, reported having regretted overdose and fearing that he was  going to lose his wife and in part appeared to be upset because of conflict  and him using, and his wife's reaction, rather than the seriousness of the  overdose itself.  Insight quite superficial, no psychotic symptoms,  cognitively intact, minimizing the severity of substance abuse or addiction  issues as well, with  little responsibility taken for recent actions.   ADMISSION DIAGNOSES:   AXIS I:  1. Major depressive disorder, recurrent, severe.  2. Alcohol abuse.  3. Cocaine abuse.  4. Polysubstance abuse.  5. Rule out bipolar 2.  6. Rule out substance-induced mood disorder.   AXIS II:  Deferred.   AXIS III:  Status post polypharmacy overdose.   AXIS IV:  Severe problems related to marriage and inability to work due to  medical condition, possibly consequences of substance use as well.   AXIS V:  25/55.   HOSPITAL COURSE:  The patient was admitted and ordered routine p.r.n.  medications, underwent further monitoring, and was encouraged to participate  in individual, group and milieu therapy.  The patient was admitted after  being stabilized medically in transfer from the medical facility.  He was  placed on a Librium detox and restarted on Lexapro, Protonix, placed on q.15  minute checks for safety.  The patient was tearful, regretting overdose, did  want to die at the time.  Had no memory however of the overdose, is not sure  exactly what happened.  Reports that his wife pesters him a lot and this may  have pushed him to the edge, not aware of the extent of his substance use  and behavior prior to overdose.  Was stabilized in ICU.  The patient was  medically monitored.  Pain was treated.  He gradually reported a positive  response to medication changes and was initially agreeable to a 28-day  substance abuse treatment program, in part because this is what the wife  wanted.  However he did not have the insurance benefits and financially they  felt they were not able to afford this and therefore they both agreed that  CDIOP would be the best option.  The patient reported resolution of suicidal  thoughts and stabilization of mood and improvement in depressive symptoms,  showing no unsafe behaviors or thoughts during hospitalization, responding  to clinical intervention.  The patient's  insight regarding the severity of  his condition, especially substance abuse issues and responsibility for  behavior still was somewhat superficial, therefore prognosis is guarded.  The patient however was discharged in markedly improved condition after  medication education on medicines:   DISCHARGE MEDICATIONS:  1. Seroquel 25 mg q.4 p.r.n. agitation.  2. Vistaril 50 mg q.6 p.r.n. anxiety.  3. Motrin 800 mg q.8 p.r.n. pain.  4. Protonix 40 mg q.a.m.  5. Cymbalta 6 mg q.a.m.  6. Zyprexa 5 mg q.h.s.  7. Trazodone 100 mg 2 q.h.s.  8. Lamictal 25 mg q.a.m. x1 week and 50 mg x2 weeks, then 100 mg q.a.m.  9. The patient was also given a Nicoderm patch, wanted to stop smoking, and     was to avoid alcohol and all substances.   DISPOSITION:  Follow up with Dr. Kathrynn Running outpatient psychiatrist and the  [facility] for CDIOP.  Was due to [facility] for appointment also to follow  up with individual therapy, substance abuse counseling and with his primary  care physician.                                               Jeanice Lim, M.D.    Lovie Macadamia  D:  08/18/2003  T:  08/19/2003  Job:  [id number]

## 2010-08-27 NOTE — H&P (Signed)
NAME:  [name], [name]                         ACCOUNT NO.:  [id number]   MEDICAL RECORD NO.:  [id number]                   PATIENT TYPE:  EMS   LOCATION:  ED                                   FACILITY:  [facility]   PHYSICIAN:  Nanetta Batty, M.D.                DATE OF BIRTH:  10-22-1965   DATE OF ADMISSION:  08/29/2002  DATE OF DISCHARGE:                                HISTORY & PHYSICAL   CHIEF COMPLAINT:  Chest pain.   HISTORY OF PRESENT ILLNESS:  Mr. Breit is a 45 year old male seen in the  [facility] emergency room with complaints of chest pain.  He was at a child  custody hearing today and at lunch had chest pressure, some left arm  discomfort and shortness of breath.  Witnesses say he became diaphoretic.  He admits to having some intermittent chest tightness at work for the past  year or so which is relieved with rest.  He was given nitroglycerin in the  emergency room at [facility] and says his symptoms eased.  He is currently  pain free.   PAST MEDICAL HISTORY:  Unremarkable for hypertension and diabetes or  hyperlipidemia.  He does have gastroesophageal reflux disease.   CURRENT MEDICATIONS:  Nexium.   ALLERGIES:  No known drug allergies.   SOCIAL HISTORY:  He is a 2-pack a day smoker.  He is married with six  children.  He is currently in the process of getting a divorce.  He denies  alcohol or drug use.   FAMILY HISTORY:  Mother had an myocardial infarction at 56.  Father died at  7 of lung cancer.  He has three brothers all healthy, and one sister who is  healthy.   REVIEW OF SYSTEMS:  Essentially unremarkable except as noted above, he does  have a history of diverticulitis and had a colonoscopy last summer.  He has  history of nephrolithiasis.   PHYSICAL EXAMINATION:  VITAL SIGNS: Blood pressure 128/80, pulse 78, O2  saturation is 99%, respirations 12.  GENERAL: He is a well-developed, well-nourished male in no acute distress.  HEENT: Normocephalic.   Extraocular movements are intact.  Sclerae anicteric,  lids and conjunctivae within normal limits.  NECK: Without bruit, without JVD.  CHEST: Clear to auscultation and percussion.  CARDIAC: Exam reveals regular rate and rhythm without murmur, rub or gallop,  normal S1, S2.  ABDOMEN: Nontender, no hepatosplenomegaly.  EXTREMITIES: Without edema.  Distal pulses are 3+/4 bilaterally.  NEUROLOGIC: Exam is grossly intact.  He is awake and alert and moves all  four extremities without obvious deficit.   ELECTROCARDIOGRAM:  His EKG shows a sinus rhythm with Q wave in II and aVF.   LABORATORY DATA:  White count 8700, hemoglobin 17.4, hematocrit 52,  platelets 271,000.  CK 128, MB 1.2, troponin is 0.01.  Sodium 135, potassium  3.8, BUN 11, creatinine 0.8, glucose 87.  Chest x-ray shows no active  disease per emergency room interpretation.   IMPRESSION:  1. Chest pain consistent with unstable angina.  2. History of smoking.  3. Gastroesophageal reflux disease.  4. Nephrolithiasis.  5. History of diverticulitis.   PLAN:  The patient will be admitted to telemetry for further evaluation.  He  has been given aspirin in the emergency room.     Abelino Derrick, P.A.                      Nanetta Batty, M.D.    Lenard Lance  D:  08/29/2002  T:  08/30/2002  Job:  [id number]

## 2010-08-27 NOTE — H&P (Signed)
NAME:  [name], [name] NO.:  [id number]   MEDICAL RECORD NO.:  [id number]          PATIENT TYPE:  IPS   LOCATION:  0304                          [facility]ACILITY:  [facility]   PHYSICIAN:  Anselm Jungling, MD  DATE OF BIRTH:  1966-01-09   DATE OF ADMISSION:  05/23/2005  DATE OF DISCHARGE:                         PSYCHIATRIC ADMISSION ASSESSMENT   IDENTIFYING INFORMATION:  A 45 year old divorced white male, voluntarily  admitted on May 24, 2003.   HISTORY OF PRESENT ILLNESS:  The patient presents with a history of alcohol  abuse, has been drinking for approximately 1 year, drinking daily, up to a  fifth a day.  His last drink was on May 23, 2005, drinking for years.  He has had a history of sobriety for 3 years in 2001-2004.  He states  yesterday that something just clicked and he felt that he needed help.  He  normally drinks alone.  He denies any drinking at work.  He needs his work,  otherwise he states I'll be homeless.  He reports he does drink in the  morning when he is not working, denies any seizure activity.  He feels  depressed but denies any suicidal thoughts, sleeping only about 4 hours at  night.  He reports he has been compliant with his medications.   PAST PSYCHIATRIC HISTORY:  The patient was here about 2 years ago for  depression and overdose.  He cannot remember what he overdosed on.  Has a  history of bipolar disorder, sees Dr. Marlyne Beards for  his mental health care.   SOCIAL HISTORY:  He is a 45 year old divorced white male, has 4 children,  14, 12, 11 and 8.  He lives alone.  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   FAMILY HISTORY:  None.   ALCOHOL DRUG HISTORY:  The patient smokes 2 packs a day, has been drinking  since the age of 10, denies any drug use.   PAST MEDICAL HISTORY:  Primary care Mykenna Viele is [facility] [address].  Medical problems  are GERD.   MEDICATIONS:  Has been on Neurontin 600 mg b.i.d., lithium 450 b.i.d.,  Nexium 40 mg daily.   DRUG ALLERGIES:  No known allergies.   PHYSICAL EXAMINATION:  The patient was assessed at [facility] Emergency  Department.  This is a 45 year old male in no acute distress, no tremors are  noted.  He was complaining of some diarrhea.  Temperature is 98.2, heart  rate 91, respiratory rate was 20, blood pressure 122/83.  His pulse oximetry  was 96%.  Alcohol level was 256 on admission.  His liver enzymes are mildly  elevated with an AST of 61, ALT of 53.  Lithium level less than 0.25.  Urine  drug screen is negative.   MENTAL STATUS EXAM:  He is a fully  alert male in no acute distress.  He is  casually dressed with little eye contact.  Speech is clear, normal rate and  tone.  The patient feels depressed, affect is flat, The patient seems  somewhat irritated that no one has helped him out with his complaints of  pain at this time.  Thought processes are coherent and organized.  No  evidence of any psychotic symptoms.  Cognitive function intact.  Memory is  good, judgment is fair, insight is fair, poor impulse control.   ADMISSION DIAGNOSES:  AXIS I:  Bipolar disorder, alcohol dependence.  AXIS II:  Deferred.  AXIS III:  Hypertension.  AXIS IV:  Other psychosocial problems.  AXIS V:  Current is 40.   PLAN:  Plan is to detox patient, work on relapse prevention.  We will offer  fluids, will monitor withdrawal symptoms.  Medication compliance will be  reinforced with the patient.  Will consider Revia or Campral for cravings.  The patient is to follow up with AA, CDIOP program.  The patient is to  continue to follow up with Dr. Marlyne Beards.   TENTATIVE LENGTH OF CARE:  3-4 days.      Landry Corporal, Robertson.P.      Anselm Jungling, MD  Electronically Signed    JO/MEDQ  D:  05/24/2005  T:  05/24/2005  Job:  [id number]

## 2010-08-27 NOTE — H&P (Signed)
NAME:  [name], [name]                         ACCOUNT NO.:  [id number]   MEDICAL RECORD NO.:  [id number]                   PATIENT TYPE:  INP   LOCATION:  0157                                 FACILITY:  [facility]   PHYSICIAN:  Melissa L. Ladona Ridgel, MD               DATE OF BIRTH:  Nov 05, 1965   DATE OF ADMISSION:  06/29/2003  DATE OF DISCHARGE:                                HISTORY & PHYSICAL   CHIEF COMPLAINT:  Drug overdose.   HISTORY OF PRESENT ILLNESS:  The patient is a 45 year old white male who  called his brother, telling him that he took a drug overdose secondary to  some marital problems he was having.  Initially in the emergency room, the  patient was talking and able to answer questions; however, shortly  thereafter he became obtunded, with very shallow sonorous respirations.  An  ABG was obtained showing acidotic picture with increased PCO2.  BiPAP was  attempted, but the patient was not generating an appropriate respiratory  drive to benefit him.  He was therefore intubated for airway protection, and  for oxygenation and gas exchange.  History has been given by his wife, who  states that he has been acting like he has attention deficit disorder, with  loss of focus, and hyper.  He has been very forgetful.  Recently, his  medications have been adjusted.  He has been taken off of Trileptal and was  given a starter pack of Lamictal, in addition to his Lexapro, Zyprexa, and  clonazepam.  His Seroquel was recently discontinued.  His wife relates that  he is self-medicating.  He recently started reacquainting with a friend from  his childhood, and he has been drinking.  His last known drink was Friday.  She states that he used to use marijuana; however, she is not aware that he  is using an illicit drugs at this time.  She is unaware of him using  cocaine.  The patient's wife states that his behavior has become erratic,  and, because of the self-medicating, his medications have been  locked in a  box, which tonight he unfortunately accessed with her keys.  The medications  that he had access to were as above.   REVIEW OF SYSTEMS:  Unable to be obtained secondary to the patient being  obtunded.   PAST MEDICAL HISTORY:  1. Diverticulosis.  2. He has had a cardiac catheterization secondary to complaint of chest pain     back in May, and this was negative.   PAST SURGICAL HISTORY:  None.   SOCIAL HISTORY:  He is a 2 pack per day smoker.  He occasionally drinks  ethanol.  He has a history of marijuana use many years ago.  His wife is  unaware of any other illicit drug use.  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   FAMILY HISTORY:  Unknown as per the wife;  however, old records indicate that  father died at 73 from lung cancer.  His mother had an MI in her 58's.  He  has 3 brothers with CAD.   PHYSICAL EXAMINATION:  VITAL SIGNS:  In the emergency room, temperature was  97.9 degrees, blood pressure 116/75, pulse 87, respiratory rate 26, 99%.  GENERAL:  The patient is unresponsive, even to sternal rub.  HEENT:  He is normocephalic and atraumatic.  Pupils equal, round and  reactive to light.  Extraocular muscles cannot be assessed.  Mucous  membranes are dry.  NECK:  Supple.  There is no JVD, no lymph nodes, no carotid bruits.  CHEST:  Decreased breath sounds bilaterally at the bases with positive  snoring and shallow breathing.  CARDIOVASCULAR:  Regular rate and rhythm.  Positive S1 and S2.  No S3 or S4.  No murmurs, rubs, or gallops are heard.  ABDOMEN:  Obese, nontender; at least not indicated as tender by the patient  on palpation.  Nondistended with decreased bowel sounds.  EXTREMITIES:  Plantars downgoing bilaterally.  Deep tendon reflexes are  decreased bilaterally symmetrically to a 1.  NEUROLOGIC:  Glasgow coma scale of 3.  Required intubation.   LABORATORY DATA:  White count of 10.1, hemoglobin 17, hematocrit 40.8, and  platelet count of 296.   His sodium is 133, potassium 3.1, chloride 101, CO2  of 26, BUN 17, creatinine 0.9.  Glucose is 100.  His ABG reveals an acidosis  of 7.275 with a PCO2 of 59.3, PO2 of 115.9, and a bicarb of 26.9.  His LFT's  are within normal limits.  His urinalysis is negative.  His UDS is positive  for cocaine and benzodiazepines.  Tylenol is less than 10.  Salicylate is  less than 4 times 2.  Initial set of CK's revealed a CPK of 101, an MB of  0.9, troponins less than 0.01.  EKG shows a heart rate of 87, normal sinus  rhythm, no acute ST-T wave changes.  There do appear to be Q's in II, III,  and aVF.  CT of his head was performed, and was nondiagnostic secondary to  movement artifact.  There do appear to be no gross abnormalities, however,  as per radiology.   ASSESSMENT AND PLAN:  This is a 45 year old white male with a drug overdose  likely composed of clonazepam, Zyprexa, Trileptal, Lexapro, and possibly  Lamictal.  His UDS is positive for cocaine.  1. The patient with a Glasgow coma scale of less than 3 with an acidosis,     and needs to be intubated at this time.  Ativan and Versed sedation     protocol will be undertaken, and tidal volume of 600 with a rate of 10,     PEEP of 5 and 50% will be utilized for his ventilator settings.  Will     check an ABG 1 hour after intubation.  CCM will evaluate the patient in     the morning.  I have discussed the case with Dr. Sung Amabile.  2. Cardiovascular.  The patient appears to be abusing cocaine.  His first     set of CK's were negative.  His EKG came back, and we will recheck that     in the morning.  Will also check a TSH.  3. GI.  Will start IV Protonix.  The patient will be NPO.  4. GU.  Foley to gravity.  5. IV hydration with normal saline at 100 cc per hour.  6. Restraints for patient's protection.  7. PAS hose for deep vein thrombosis prophylaxis.  8. The patient will have a psychiatric consult when the patient is awake,     alert, and  oriented.                                               Melissa L. Ladona Ridgel, MD    MLT/MEDQ  D:  06/30/2003  T:  07/01/2003  Job:  [id number]   cc:   Dr. Kathrynn Running

## 2010-08-27 NOTE — H&P (Signed)
NAME:  [name], [name] NO.:  [id number]   MEDICAL RECORD NO.:  [id number]          PATIENT TYPE:  IPS   LOCATION:  0306                          [facility]ACILITY:  [facility]   PHYSICIAN:  Jeanice Lim, M.D. DATE OF BIRTH:  05/10/1965   DATE OF ADMISSION:  04/26/2004  DATE OF DISCHARGE:                         PSYCHIATRIC ADMISSION ASSESSMENT   IDENTIFYING STATEMENT:  This is a 45 year old,  married but separated,  Caucasian male.  This is a voluntary admission.   HISTORY OF PRESENT ILLNESS:  This patient was referred by a psychotherapist,  __________ , for increasing depression, tearful, and drinking about 1/2 of a  fifth of alcohol almost every day.  He has been tearful, says he feels like  his life is out of control; and that his drinking is out of control; has  been very lonely lately, living alone; has been relapsed on alcohol about 3-  4 months ago since he started living alone; also occasionally using cocaine,  smoking a rock about once every couple of weeks.  He reports his mood has  been up-and-down with episodes of being very depressed and tearful.  He went  off of his medications several months ago, not sure that they were helping  him.  He has been separated from his wife for several months; and was  previously living with his parents, now living alone, caring for 4 sons, 1  night a week, and almost every weekend.  He endorsed suicidal thoughts of  wanting to drive into something and presented as very tearful with a labile  mood.   PAST PSYCHIATRIC HISTORY:  This is the patient's sixth admission to [facility] since 2001 with his last one being March 22-30  of 2005.  He has a history of multiple suicide attempts, a past history of  both alcohol and cocaine abuse.  The patient in the past has been treated  with Lamictal, Zyprexa, Seroquel, Effexor and Cymbalta all at various times  and tolerated them fairly well.   SOCIAL HISTORY:  The  is a separated white male who has 4 sons, aged 24 down  to the youngest being 44 years of age.  No legal problems.  He is employed as  a Landscape architect for a bread route.  He has parents nearby who are  supportive.   FAMILY HISTORY:  The patient denies any family history of mental illness or  substance abuse, alcohol or drug history.  The patient has a history of  using alcohol; and had been sober for several months up until he relapsed  approximately 3 months ago.  He first began to drink because he felt better  when he was drinking, then became more of a habitual drinker and now reports  that he has been using about 1/2 of a fifth of alcohol per day and drank  immediately before coming in for evaluation.  Cocaine involves smoking rock  about every-other-week.  He is not doing this regularly and only does it  under the influence of alcohol.  He denies that he has alcohol or cocaine  cravings.   MEDICAL HISTORY:  The patient has no regular, primary care Cal[facility] Lafontant. He has  been most recently seen at the Urgent [facility] on [address].  Medical  problems include a history of hypertension, and the patient most recently  fell while he was under the influence of alcohol and fractured his left  third toe and was placed in a platform shoe. He has since lost the shoe and  is having some pain in the foot.   PAST MEDICAL HISTORY:  Past medical history is remarkable for kidney stones  and history of peptic ulcer.  Denies any history of seizures, blackouts,  memory loss, liver disease, or kidney disease.  No surgeries.   MEDICATIONS:  The patient takes Nexium 40 mg p.o. daily for GERD.   DRUG ALLERGIES:  None.   POSITIVE PHYSICAL FINDINGS:  GENERAL:  This is a stocky-built white male who  appears to be his stated age.  VITAL SIGNS:  Height 5 feet 9 inches tall, 223 pounds. Temperature 97.9,  pulse 101, respirations 20, blood pressure 138/84.  HEENT:  Head normocephalic, atraumatic.  EENT  PERRL.  Sclerae nonicteric.  Extraocular movements are normal.  No rhinorrhea.  Oropharynx not injected  and teeth in satisfactory condition.  NECK:  Supple, no thyromegaly, no JVD.  No carotid bruits.  No  lymphadenopathy.  CHEST:  Symmetrical and clear to auscultation.  CARDIOVASCULAR:  S1 and S2 is heard.  No clicks, murmurs or gallops. No  extra sounds.  Apical pulse is 88, synchronous with radial pulse.  ABDOMEN:  Rounded, soft, nontender, no masses appreciated.  GENITOURINARY:  Deferred.  EXTREMITIES:  Pink and warm with good capillary refill.  No peripheral  edema.  Left third toe is swollen and a bit purplish, but circulation is  good and sensory is intact.  NEUROLOGIC:  Cranial nerves II-XII intact.  Extraocular movements are  normal.  Motor and facial symmetry is present.  Gait is normal with normal  arm swing.  No signs of EPS.  Neurologic is nonfocal.   MENTAL STATUS EXAM:  This is a fully alert male, cooperative with a labile  and tearful affect, poor eye contact.  He has described a history of  auditory hallucinations, but does not appear internally distracted at this  time.  Speech is decreased in amount, pressured from time-to-time, normal  paced, mood is depressed, labile, and guarded.  Thought process is  logically, no flight of ideas, does have some psychomotor agitation going  on.  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  No homicidal thoughts.  Cognitively he is intact and oriented x4.  Insight poor.  Impulse, control, and judgment within normal limits.  Intelligence within normal limits.   AXIS I:  1.  Rule out bipolar.  2.  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  3.  Alcohol abuse and dependence.  4.  Cocaine abuse.   AXIS II:  Deferred.   AXIS III:  No diagnosis.   AXIS IV:  Severe social isolation and stress with being a single  parent.  AXIS V:  Current 20, past year 6.   PLAN:  The plan is to involuntarily admit the patient with every 15 minute  checks in place, with the goal of a safe detox within 5 days and to  alleviate his suicidal thought and stabilize his mood.  We have placed him  on a Librium protocol for safe detox from alcohol and we are going to start  him on Risperdal 0.25 mg b.i.d. to help stabilize his  mood and we will also restart his Lamictal.  Meanwhile we will place him in  a platform shoe because he is having some pain in his foot and will, in turn  give him some Darvocet-N 100 one tab t.i.d. p.r.n. for pain.   ESTIMATED LENGTH OF STAY:  7 days.      MAS/MEDQ  D:  04/27/2004  T:  04/27/2004  Job:  [id number]

## 2010-08-27 NOTE — Discharge Summary (Signed)
[facility]  Patient:    YOUSIF, [name]                      MRN: [id number] Adm. Date:  [id number] Disc. Date: [id number] Attending:  Otilio Saber Dictator:   Candi Leash. Orsini, N.P.                           Discharge Summary  [name] is a 45 year old married white male who was admitted on a voluntary basis after taking an overdose of Klonopin and Seroquel with his intention to die.  He was initially referred by [name]. He has been having extreme anxiety after returning to work on November 29, 1999, where he took the overdose.  Prior to admission he was involved in a custody battle with his ex-wife concerning their children.  He was feeling hopeless and took the Seroquel tablets.  His appetite has decreased.  He is becoming more angry and irritable and was thus admitted.  He has been hospitalized to [facility] two weeks ago prior to admission and does attend an outpatient clinic with [name].  He has no primary care physician.  ADMISSION MEDICATIONS: 1. Klonopin 0.5 mg p.o. b.i.d. 2. Pepcid 20 mg p.o. b.i.d. 3. Effexor XR 75 mg p.o. b.i.d. 4. Depakote ER 1000 mg q.h.s. 5. Seroquel 25 mg p.o. q.h.s.  MEDICAL PROBLEMS:  A history of gastritis, history of ulcer and kidney stones.  ALLERGIES:  No known drug allergies.  PHYSICAL EXAMINATION:  A physical examination was done at [facility] ED on November 25, 1999, with no significant findings.  MENTAL STATUS EXAMINATION:  He was a casually dressed younger adult who was passively cooperative.  He was angry and with poor eye contact. Speech is normal and relevant.  Mood was agitated and tearful.  Affect was labile. Positive for suicidal ideation.  No homicidal ideation.  Thought processes are logical and coherent without psychosis.  Cognitive:  He is alert and oriented. Judgement is poor.  Insight is poor and he has poor impulse control.  ADMISSION  DIAGNOSES: AXIS I.   Major depression, recurrent, with suicide attempt. AXIS II.  Deferred. AXIS III. None. AXIS IV.  Severe, related to problems with social environment, occupational           problems and economic problems. AXIS V.   Current Global Assessment of Functioning is 40 with highest in the           past 65.  LABORATORY DATA:  His BUN is less than 5.  His Depakote level is 62.5.  HOSPITAL COURSE:  The patient was admitted to [facility] for treatment of his suicide attempt.  In addition to his routine medications, we had added Seroquel 75 mg p.r.n. for anxiety, increased his Klonopin to q.i.d., increased his Seroquel to 50 mg q.4h. p.r.n.  The patient was initially feeling very agitated, angry and irritable, sleeping pretty well.  His appetite was good.  With the increased Seroquel, the patient was beginning to feel a little calmer.  He was sleeping fair with his appetite good.  His mood and affect continued to be still a little depressed and sullen.  He was denying suicidal ideation.  On the day of discharge the patient felt much calmer.  His mood was less depressed and anxiety.  He was sleeping well.  His appetite was good and he was  denying any suicidal ideation.  The patient was discharged to home.  FOLLOW-UP:  He was to follow up with [name]. [name], M.D., on December 06, 1999.  DISCHARGE MEDICATIONS: 1. Seroquel 25 mg two tablets q.h.s. two every four hours as needed. 2. Klonopin 0.5 mg one tablet q.i.d. 3. Depakote 500 mg ER two tablets q.h.s. 4. Effexor 75 mg XR one tablet q. morning and at lunch. 5. Pepcid 20 mg one tablet b.i.d.  FINAL DIAGNOSES: AXIS I.   Major depression, recurrent, with suicide attempt. AXIS II.  Deferred. AXIS III. None. AXIS IV.  Severe, related to problems with social environment, occupational           problems and economic problems. AXIS V.   Current Global Assessment of Functioning is 55 with highest in the            past 65. DD:  12/29/99 TD:  12/30/99 Job: [id number]

## 2010-08-27 NOTE — Assessment & Plan Note (Signed)
[facility]                             PULMONARY OFFICE NOTE   NAME:[name], [name]                      MRN:          [id number]  DATE:05/12/2006                            DOB:          [birth year]    HISTORY:  This is a 45 year old white male, active smoker, who was seen  on May 03, 2006 in the office for evaluation of chest pain after  falling off a fence on April 28, 2006.  The initial scans did not show  any obvious rib fracture, but on CT scan, not only did he have a  posterior rib fracture that was displaced, but also a left pleural  effusion.  He was seen by Trauma Service, which did not feel anything  further needed to be done but consideration for referral to the Pain  Clinic if his pain remained refractory.  At the time of admission he was  coughing and was on Lisinopril and actively smoking.  We stopped the  Lisinopril.  Unfortunately, he did not stop the smoking.  He was  discharged on Clonidine 0.2 mg t.i.d. and Tramadol 50 mg q.i.d. along  with OxyContin 40 mg b.i.d., but despite this, continues to use Dilaudid  up to 3 or 4 times daily.   Today, he is ambulatory (note he was wheelchair bound on his last  visit).  Ambulatory white male, in no acute distress.  He has stable vital signs.  HEENT:  Unremarkable.  Oropharynx clear.  LUNG FIELDS:  Reveal slightly diminished breath sounds at the left base.  There is no ecchymosis.  He has no longer has such extreme sensitivity  over the chest wall area.  HEART:  Regular rate and rhythm without murmur, gallop or rub.  ABDOMEN:  Soft and benign.  No hepatomegaly, masses, tenderness or  ecchymosis.  EXTREMITIES:  Warm without calf tenderness, cyanosis, clubbing or edema.   Chest x-ray is pending.   IMPRESSION:  1. Severe chest wall pain.  Initially disproportionate to objective      findings, but he did have a fib fracture, and probably had      significant neuropathic pain on this  basis.  I have, therefore,      recommended starting Zostrix cream q.i.d. and referring him to the      Pain Clinic where he may need to be considered for nerve injection      therapy.  2. Hypertension.  Adequately controlled off ACE inhibitors.  I would      certainly avoid ACE inhibitors now and in the future in this      patient who tends to cough from cigarettes.  3. We did discuss the smoking issue as well.  The patient says he is      just not ready to quit yet.  4. Small left pleural effusion.  Probably represents either a small      hemothorax or sympathetic effusion.  We will check a followup      chest x-ray, but unless it has progressed at this point, nothing      further needs to  be done, as it should resolve on its own.     Charlaine Dalton. Sherene Sires, MD, [facility]  Electronically Signed    MBW/MedQ  DD: 05/12/2006  DT: 05/12/2006  Job #: [id number]

## 2010-08-27 NOTE — Cardiovascular Report (Signed)
NAME:  [name], [name]                      ACCOUNT NO.:  [id number]   MEDICAL RECORD NO.:  [id number]                   PATIENT TYPE:  OUT   LOCATION:  CATH                                 FACILITY:  [facility]   PHYSICIAN:  Nanetta Batty, M.D.                DATE OF BIRTH:  Jun 21, 1965   DATE OF PROCEDURE:  08/30/2002  DATE OF DISCHARGE:                              CARDIAC CATHETERIZATION   INDICATIONS:  The patient is a 45 year old divorced white male with risk  factors including tobacco use who was admitted May 20 with symptoms  consistent with unstable angina and negative enzymes.  There were no acute  EKG changes but there were some inferior Q-waves.  The patient presents now  for diagnostic coronary arteriography, rule out ischemic etiology.   PROCEDURE DESCRIPTION:  The patient was brought to the second floor [facility] Cardiac Catheterization Laboratory __________ .  He was premedicated  with p.o. Valium, IV Nubain, and Versed.  His right groin was prepped and  draped in the usual sterile fashion.  1% Xylocaine was used for local  anesthesia.  A 6-French sheath was inserted into the right femoral artery  using standard Seldinger technique.  A 6-French right and left Judkins  diagnostic catheter along with 6 French pigtail catheter were used for  selective coronary angiography, left ventriculography, and supravalvular  aortography.  Omnipaque dye was used for the entirety of case.  Retrograde  aorta, ventricular, and pullback pressures were recorded.   HEMODYNAMICS:  1. Aortic systolic pressure 137, diastolic pressure 88.  2. Left ventricular systolic pressure 143 and diastolic pressure 21.   SELECTIVE CORONARY ANGIOGRAPHY:  1. Left main:  Normal.  2. LAD:  Normal.  3. Left circumflex was nondominant, normal.  4. Ramus intermedius branch was normal.  5. Right coronary artery was dominant, normal.   LEFT VENTRICULOGRAPHY:  RAO left ventriculogram was performed using 25  mL of  Omnipaque dye at 12 mL/second.  The overall LV EF was estimated at greater  than 60% without focal wall motion abnormalities.   SUPRAVALVULAR AORTOGRAPHY:  Performed on the LAO cranial view using 20 mL of  Omnipaque dye at 20 mL/second.  There is no evidence of aortic dissection.  There is no AI noted.  Large vessels were intact.   IMPRESSION:  The patient has normal catheterization, normal left ventricular  function, and no evidence of aortic dissection.  I suspect his chest pain  was related to reflux plus or minus anxiety.  An ACT was measured and the  sheaths were removed.  Pressure was put on the groin to achieve hemostasis.  The patient left laboratory in stable  condition.  He will be treated with a PPI and discharged home.  He will be  seeing in 2 weeks for groin check at which time we will find him a primary  care physician for ongoing care.  We will also deal with  his cardiac risk  factors, specifically tobacco abuse.  He left the laboratory in stable  condition.                                               Nanetta Batty, M.D.    Cordelia Pen  D:  08/30/2002  T:  08/30/2002  Job:  [id number]   cc:   2nd floor [facility] Cath Lab   [facility]  [address]  [place] [address]

## 2010-08-27 NOTE — Discharge Summary (Signed)
NAME:  [name], [name] NO.:  [id number]   MEDICAL RECORD NO.:  [id number]          PATIENT TYPE:  IPS   LOCATION:  0503                          FACILITY:  [facility]   PHYSICIAN:  Anselm Jungling, MD  DATE OF BIRTH:  04/24/1965   DATE OF ADMISSION:  05/23/2005  DATE OF DISCHARGE:  05/28/2005                                 DISCHARGE SUMMARY   IDENTIFYING DATA AND REASON FOR ADMISSION:  The patient is a 45 year old  divorced white male, father of four children, working as a Associate Professor, who came in for alcohol detoxification. He tried to stop  drinking on his own without success. The patient came to [place] a patient of Dr.  Marlyne Beards, psychiatrist, and had been taking lithium and Neurontin for  presumed bipolar disorder. He came to [place] with a prior history of suicide  attempt 2 years prior to admission, and had been treated here at our  facility for that. Please refer to the admission note for further details  pertaining to the symptoms, circumstances and history that led to his  hospitalization. He was given an initial Axis I diagnosis of alcohol  dependence, and mood disorder NOS.   MEDICAL AND LABORATORY:  The patient was medically and physically evaluated  by the psychiatric nurse practitioner and was followed throughout his  inpatient stay. Admission laboratory showed slightly decreased potassium of  3.3, elevated SGOT and SGPT at 61 and 53 respectively, and decreased albumin  at 3.4. A lithium level was less than 0.25. CBC was within normal limits.  TSH, free T4, and free triiodothyronine were within normal limits. A  toxicology screen was negative.   An EKG was within normal limits. Cardiac enzymes were within normal limits  as well.   The only significant medical issues during his inpatient stay, aside from  alcohol detoxification, was complaints of severe migrainous headache, which  persisted through much of his inpatient stay. He indicated that he had  had a  history of migraine headaches prior. He was given a variety of different  analgesics to address this.   HOSPITAL COURSE:  The patient was admitted to the adult inpatient  psychiatric service. He was placed on a Librium detoxification protocol for  alcohol withdrawal. He was continued on his usual lithium, 450 mg b.i.d.,  and Neurontin 600 mg b.i.d.. Nexium 40 mg daily was ordered for GERD.   To address headache, initially Ultram was tried without much success.  Clonidine was added to his regimen along with Seroquel to address complaints  of internal anxiety and agitation. At times, additional doses of Librium  seemed to be required to address his withdrawal symptoms of agitation and  tremulousness.   His physical complaints led to a CT scan, which was negative, EKG and  cardiac enzymes, which were within normal limits. He was given a trial of  Fioricet for headache, which was also unsuccessful. Finally, he was ordered  Dilaudid 2 mg IM, and this was given on several occasions to address his  headache.   The patient was initially quite pleasant and cooperative, but as  his  headache got worse, and persisted, he became increasingly irritable. His  physical complaints including headache precluded him from participating in  some of the therapeutic groups and activities.   By the sixth hospital day, it was apparent that the patient had completed  his detoxification protocol. He was continuing to have headache, but it was  felt that as much as possible had been done to evaluate and treat his  headache within the scope of our treatment program.   The patient was also begun on Campral 666 mg t.i.d. for alcohol cessation,  and Toprol XL 25 mg daily.   At the time of discharge, the patient was still bothered by some headache  symptoms, but was absent alcohol withdrawal symptoms, per se. His mood was  moderately depressed, but he was absent suicidal ideation.   AFTERCARE:  The  patient was to follow-up with Dr. Marlyne Beards, appointment to  be arranged at the time of this dictation.   DISCHARGE MEDICATIONS:  Lithium 450 mg b.i.d., Neurontin 600 mg q. p.m., and  300 mg three times daily, Campral 666 mg three times daily, Toprol XL 25 mg  daily, and Pepcid 20 mg b.i.d.   DISCHARGE DIAGNOSES:  AXIS I: Alcohol dependence,  bipolar disorder not  otherwise specified.  AXIS II: Deferred.  AXIS III: History of migraine headache, gastroesophageal reflux disease  AXIS IV: Stressors severe.  AXIS V: Global assessment of functioning on discharge 65.           ______________________________  Anselm Jungling, MD  Electronically Signed     SPB/MEDQ  D:  05/31/2005  T:  05/31/2005  Job:  [id number]

## 2010-08-27 NOTE — H&P (Signed)
[facility]  Patient:    [name]                       MRN: [id number] Adm. Date:  [id number] Attending:  Marlyn Corporal Fabmy Dictator:   Johnella Moloney, N.P.                         History and Physical  History and physical, review of systems and vital signs have already been dictated on the initial assessment.  GENERAL APPEARANCE:  Patient is a 45 year old Caucasian male, sitting on the exam table in no acute distress.  He is well-developed and average in stature and appears his stated age.  HEENT:  Head is normocephalic, atraumatic.  Can raise eyebrows.  Eyes: PERRLA.  EOM intact bilaterally, direct and consenual.  Funduscopic exam within normal limits.  ENT/MOUTH:  External ear canals are patent; however, slightly erythematous in the left outer ear canal.  Tympanic membranes intact, with normal cone of light.  Nostrils patent bilaterally.  No sinus tenderness. Mucosa is moist.  He has good dentition.  No lesions seen or palpated on tongue.  Tongue protrudes midline without tremor.  Can clinch his teeth and puff out cheeks.  No pharyngeal or tonsillar hyperemia or exudate noted. Pharynx appears within normal limits.  NECK:  Supple with full range of motion.  No JVD.  No lymphadenopathy. Thyroid nonpalpable, nontender, not enlarged.  RESPIRATORY:  Clear to auscultation without adventitious sounds.  Thorax symmetrical with good expansion.  CARDIOVASCULAR:  Regular rate and rhythm without murmurs.  Carotid pulses equal and active bilaterally.  No carotid bruits auscultated.  No edema or varicosities noted.  Pedal pulses are equal and adequate.  CHEST:  Breasts are symmetrical.  ABDOMEN:  Inspection reveals slightly protuberant, soft, nontender abdomen. No masses, organomegaly or rebound tenderness.  No active bowel sounds in all four quadrants.  No CVA tenderness.  LYMPH:  No lymphadenopathy.  MUSCULOSKELETAL:  Gait is normal.  Good  range of motion.  Muscular strength and tone are equal bilaterally.  SKIN:  Warm and dry.  Good turgor.  NEUROLOGIC:  He is oriented x 5.  Cranial nerves are grossly intact.  Deep tendon reflexes are 2+, equal and adequate, upper and lower extremities.  Good grip strength bilaterally.  No involuntary movement.  Gait is normal. Cerebellar function intact with finger-to-finger, heel-to-shin and normal alternating movements with normal gait and his Romberg was negative. DD: 11/11/99 TD:  11/12/99 Job: [id number]

## 2011-01-20 LAB — COMPREHENSIVE METABOLIC PANEL
ALT: 40
ALT: 44
Albumin: 3 — ABNORMAL LOW
Albumin: 3.1 — ABNORMAL LOW
Alkaline Phosphatase: 78
Alkaline Phosphatase: 83
BUN: 5 — ABNORMAL LOW
BUN: <1 — ABNORMAL LOW
CO2: 25
CO2: 26
Calcium: 8.9
Calcium: 9.2
Chloride: 108
Creatinine, Ser: 0.85
Creatinine, Ser: 1.12
GFR calc Af Amer: >60
GFR calc Af Amer: >60
GFR calc non Af Amer: >60
Glucose, Bld: 111 — ABNORMAL HIGH
Glucose, Bld: 118 — ABNORMAL HIGH
Potassium: 3.5
Potassium: 3.6
Potassium: 4.4
Sodium: 135
Sodium: 136
Total Bilirubin: 0.7
Total Protein: 6.2
Total Protein: 6.4
Total Protein: 6.7

## 2011-01-20 LAB — URINALYSIS, ROUTINE W REFLEX MICROSCOPIC
Glucose, UA: NEGATIVE
Nitrite: NEGATIVE
Protein, ur: NEGATIVE
Urobilinogen, UA: 0.2

## 2011-01-20 LAB — ALKALINE PHOSPHATASE, ISOENZYMES
ALP, Heat Stable (Liver): 82
Alk Phos Liver Fract: 83

## 2011-01-20 LAB — CULTURE, RESPIRATORY W GRAM STAIN: Culture: NORMAL

## 2011-01-20 LAB — CARDIAC PANEL(CRET KIN+CKTOT+MB+TROPI)
Relative Index: INVALID
Troponin I: 0.05

## 2011-01-20 LAB — TSH: TSH: 4.279

## 2011-01-20 LAB — CULTURE, BLOOD (ROUTINE X 2): Culture: NO GROWTH

## 2011-01-20 LAB — EXPECTORATED SPUTUM ASSESSMENT W GRAM STAIN, RFLX TO RESP C

## 2011-01-20 LAB — CBC
HCT: 41.1
Hemoglobin: 14.3
Hemoglobin: 16.5
MCHC: 35.1
MCV: 93.2
Platelets: 235
RDW: 13.8
RDW: 14.2 — ABNORMAL HIGH
WBC: 6.7

## 2011-01-20 LAB — DIFFERENTIAL
Basophils Relative: 0
Eosinophils Absolute: 0.1
Eosinophils Absolute: 0.3
Lymphocytes Relative: 27
Lymphs Abs: 0.9
Lymphs Abs: 1.4
Monocytes Absolute: 0.5
Monocytes Relative: 11
Monocytes Relative: 9
Neutrophils Relative %: 56

## 2011-01-20 LAB — URINE CULTURE: Colony Count: NO GROWTH

## 2011-01-20 LAB — PHOSPHORUS: Phosphorus: 2.2 — ABNORMAL LOW

## 2011-01-21 LAB — COMPREHENSIVE METABOLIC PANEL
ALT: 63 — ABNORMAL HIGH
AST: 48 — ABNORMAL HIGH
Albumin: 3.4 — ABNORMAL LOW
Chloride: 103
Creatinine, Ser: 0.93
GFR calc Af Amer: >60
Sodium: 134 — ABNORMAL LOW
Total Bilirubin: 0.4

## 2011-01-21 LAB — URINALYSIS, ROUTINE W REFLEX MICROSCOPIC
Bilirubin Urine: NEGATIVE
Glucose, UA: NEGATIVE
Ketones, ur: NEGATIVE
pH: 6

## 2011-01-21 LAB — ACETAMINOPHEN LEVEL: Acetaminophen (Tylenol), Serum: <10 — ABNORMAL LOW

## 2011-01-21 LAB — POCT I-STAT 3, ART BLOOD GAS (G3+)
Acid-base deficit: 6 — ABNORMAL HIGH
O2 Saturation: 93
Patient temperature: 37
TCO2: 21

## 2011-01-21 LAB — DIFFERENTIAL
Basophils Absolute: 0
Eosinophils Absolute: 0.1
Eosinophils Relative: 2
Lymphocytes Relative: 37
Lymphs Abs: 2.7
Monocytes Absolute: 0.4

## 2011-01-21 LAB — ETHANOL: Alcohol, Ethyl (B): 223 — ABNORMAL HIGH

## 2011-01-21 LAB — RAPID URINE DRUG SCREEN, HOSP PERFORMED
Amphetamines: NOT DETECTED
Benzodiazepines: NOT DETECTED
Cocaine: NOT DETECTED
Tetrahydrocannabinol: NOT DETECTED

## 2011-01-21 LAB — MAGNESIUM: Magnesium: 1.6

## 2011-01-21 LAB — CBC
MCV: 93.5
Platelets: 249
WBC: 7.3

## 2011-02-22 MED ORDER — DEXTROSE 5 % IV SOLN: INTRAVENOUS | Status: DC

## 2011-02-23 ENCOUNTER — Encounter (HOSPITAL_BASED_OUTPATIENT_CLINIC_OR_DEPARTMENT_OTHER): Payer: Self-pay | Admitting: *Deleted

## 2011-02-24 ENCOUNTER — Encounter (HOSPITAL_BASED_OUTPATIENT_CLINIC_OR_DEPARTMENT_OTHER): Payer: Self-pay | Admitting: *Deleted

## 2011-02-24 NOTE — Progress Notes (Signed)
Pt states no alcohol since fx jaw 3/12-hx etoh abuse No ekg was done for that srg Needs ekg

## 2011-02-25 ENCOUNTER — Encounter (HOSPITAL_BASED_OUTPATIENT_CLINIC_OR_DEPARTMENT_OTHER)
Admission: RE | Disposition: A | Discharge status: Home / Self Care | Payer: Self-pay | Source: Ambulatory Visit | Attending: Orthopedic Surgery

## 2011-02-25 ENCOUNTER — Ambulatory Visit (HOSPITAL_BASED_OUTPATIENT_CLINIC_OR_DEPARTMENT_OTHER): Payer: Managed Care, Other (non HMO) | Admitting: Anesthesiology

## 2011-02-25 ENCOUNTER — Encounter (HOSPITAL_BASED_OUTPATIENT_CLINIC_OR_DEPARTMENT_OTHER): Payer: Self-pay | Admitting: Anesthesiology

## 2011-02-25 ENCOUNTER — Ambulatory Visit (HOSPITAL_BASED_OUTPATIENT_CLINIC_OR_DEPARTMENT_OTHER)
Admission: RE | Admit: 2011-02-25 | Discharge: 2011-02-25 | Disposition: A | Discharge status: Home / Self Care | Payer: Managed Care, Other (non HMO) | Source: Ambulatory Visit | Attending: Orthopedic Surgery | Admitting: Orthopedic Surgery

## 2011-02-25 DIAGNOSIS — I10 Essential (primary) hypertension: Secondary | ICD-10-CM | POA: Insufficient documentation

## 2011-02-25 DIAGNOSIS — Z01812 Encounter for preprocedural laboratory examination: Secondary | ICD-10-CM | POA: Insufficient documentation

## 2011-02-25 DIAGNOSIS — S43439A Superior glenoid labrum lesion of unspecified shoulder, initial encounter: Secondary | ICD-10-CM

## 2011-02-25 DIAGNOSIS — M24119 Other articular cartilage disorders, unspecified shoulder: Secondary | ICD-10-CM | POA: Insufficient documentation

## 2011-02-25 DIAGNOSIS — G5600 Carpal tunnel syndrome, unspecified upper limb: Secondary | ICD-10-CM | POA: Insufficient documentation

## 2011-02-25 HISTORY — PX: SHOULDER ARTHROSCOPY: SHX128

## 2011-02-25 HISTORY — PX: STERIOD INJECTION: SHX5046

## 2011-02-25 HISTORY — DX: Gastro-esophageal reflux disease without esophagitis: K21.9

## 2011-02-25 HISTORY — DX: Bipolar disorder, unspecified: F31.9

## 2011-02-25 HISTORY — DX: Alcohol abuse, uncomplicated: F10.10

## 2011-02-25 SURGERY — ARTHROSCOPY, SHOULDER
Anesthesia: General | Site: Wrist | Laterality: Left | Wound class: Clean

## 2011-02-25 MED ORDER — CHLORHEXIDINE GLUCONATE 4 % EX LIQD: 60.0000 mL | Freq: Once | CUTANEOUS | Status: DC

## 2011-02-25 MED ORDER — MIDAZOLAM HCL 2 MG/2ML IJ SOLN
1.0000 mg | INTRAMUSCULAR | Status: DC | PRN
  Administered 2011-02-25: 2 mg via INTRAVENOUS

## 2011-02-25 MED ORDER — ONDANSETRON HCL 4 MG/2ML IJ SOLN
INTRAMUSCULAR | Status: DC | PRN
  Administered 2011-02-25: 4 mg via INTRAVENOUS

## 2011-02-25 MED ORDER — BUPIVACAINE HCL (PF) 0.5 % IJ SOLN
INTRAMUSCULAR | Status: DC | PRN
  Administered 2011-02-25: 6 mL

## 2011-02-25 MED ORDER — LACTATED RINGERS IV SOLN
INTRAVENOUS | Status: DC
  Administered 2011-02-25: 07:00:00 via INTRAVENOUS

## 2011-02-25 MED ORDER — SODIUM CHLORIDE 0.9 % IR SOLN
Status: DC | PRN
  Administered 2011-02-25: 3000 mL

## 2011-02-25 MED ORDER — PROPOFOL 10 MG/ML IV EMUL
INTRAVENOUS | Status: DC | PRN
  Administered 2011-02-25: 200 mg via INTRAVENOUS

## 2011-02-25 MED ORDER — LIDOCAINE HCL (CARDIAC) 20 MG/ML IV SOLN
INTRAVENOUS | Status: DC | PRN
  Administered 2011-02-25: 50 mg via INTRAVENOUS

## 2011-02-25 MED ORDER — CEFAZOLIN SODIUM 1-5 GM-% IV SOLN: 1.0000 g | INTRAVENOUS | Status: DC

## 2011-02-25 MED ORDER — CISATRACURIUM BESYLATE 2 MG/ML IV SOLN
INTRAVENOUS | Status: DC | PRN
  Administered 2011-02-25: 12 mg via INTRAVENOUS

## 2011-02-25 MED ORDER — FENTANYL CITRATE 0.05 MG/ML IJ SOLN
50.0000 ug | INTRAMUSCULAR | Status: DC | PRN
  Administered 2011-02-25: 100 ug via INTRAVENOUS

## 2011-02-25 MED ORDER — NEOSTIGMINE METHYLSULFATE 1 MG/ML IJ SOLN
INTRAMUSCULAR | Status: DC | PRN
  Administered 2011-02-25: 5 mg via INTRAVENOUS

## 2011-02-25 MED ORDER — CEFAZOLIN SODIUM-DEXTROSE 2-3 GM-% IV SOLR
2.0000 g | INTRAVENOUS | Status: AC
Start: 2011-02-25 — End: 2011-02-25
  Administered 2011-02-25: 2 g via INTRAVENOUS

## 2011-02-25 MED ORDER — DEXAMETHASONE SODIUM PHOSPHATE 10 MG/ML IJ SOLN
INTRAMUSCULAR | Status: DC | PRN
  Administered 2011-02-25: 10 mg via INTRAVENOUS

## 2011-02-25 MED ORDER — GLYCOPYRROLATE 0.2 MG/ML IJ SOLN
INTRAMUSCULAR | Status: DC | PRN
  Administered 2011-02-25: 1 mg via INTRAVENOUS

## 2011-02-25 MED ORDER — METHYLPREDNISOLONE ACETATE PF 40 MG/ML IJ SUSP
INTRAMUSCULAR | Status: DC | PRN
  Administered 2011-02-25: 08:00:00 via INTRA_ARTICULAR

## 2011-02-25 SURGICAL SUPPLY — 73 items
BENZOIN TINCTURE PRP APPL 2/3 (GAUZE/BANDAGES/DRESSINGS) ×3 IMPLANT
BLADE 4.2CUDA (BLADE) IMPLANT
BLADE CUDA GRT WHITE 3.5 (BLADE) IMPLANT
BLADE CUTTER GATOR 3.5 (BLADE) ×3 IMPLANT
BLADE GREAT WHITE 4.2 (BLADE) IMPLANT
BLADE SURG 15 STRL LF DISP TIS (BLADE) IMPLANT
BLADE SURG 15 STRL SS (BLADE)
BUR OVAL 4.0 (BURR) IMPLANT
BUR OVAL 6.0 (BURR) ×3 IMPLANT
CANISTER OMNI JUG 16 LITER (MISCELLANEOUS) IMPLANT
CANNULA 5.75X71 LONG (CANNULA) ×3 IMPLANT
CANNULA SHOULDER 7CM (CANNULA) IMPLANT
CANNULA TWIST IN 8.25X7CM (CANNULA) IMPLANT
CANNULA TWIST IN 8.25X9CM (CANNULA) IMPLANT
CLOSURE STERI STRIP 1/2 X4 (GAUZE/BANDAGES/DRESSINGS) ×3 IMPLANT
CLOTH BEACON ORANGE TIMEOUT ST (SAFETY) ×3 IMPLANT
CUTTER MENISCUS  4.2MM (BLADE)
CUTTER MENISCUS 4.2MM (BLADE) IMPLANT
DECANTER SPIKE VIAL GLASS SM (MISCELLANEOUS) IMPLANT
DRAPE INCISE IOBAN 66X45 STRL (DRAPES) ×3 IMPLANT
DRAPE SHOULDER BEACH CHAIR (DRAPES) ×3 IMPLANT
DRAPE U 20/CS (DRAPES) ×3 IMPLANT
DRAPE U-SHAPE 47X51 STRL (DRAPES) ×3 IMPLANT
DRSG PAD ABDOMINAL 8X10 ST (GAUZE/BANDAGES/DRESSINGS) ×3 IMPLANT
DURAPREP 26ML APPLICATOR (WOUND CARE) ×3 IMPLANT
ELECT MENISCUS 165MM 90D (ELECTRODE) IMPLANT
ELECT REM PT RETURN 9FT ADLT (ELECTROSURGICAL) ×3
ELECTRODE REM PT RTRN 9FT ADLT (ELECTROSURGICAL) ×2 IMPLANT
FIBERSTICK 2 (SUTURE) IMPLANT
GAUZE SPONGE 4X4 12PLY STRL LF (GAUZE/BANDAGES/DRESSINGS) ×3 IMPLANT
GLOVE BIO SURGEON STRL SZ 6.5 (GLOVE) ×3 IMPLANT
GLOVE BIO SURGEON STRL SZ8 (GLOVE) ×3 IMPLANT
GLOVE BIOGEL PI IND STRL 7.0 (GLOVE) ×2 IMPLANT
GLOVE BIOGEL PI IND STRL 8 (GLOVE) ×4 IMPLANT
GLOVE BIOGEL PI INDICATOR 7.0 (GLOVE) ×1
GLOVE BIOGEL PI INDICATOR 8 (GLOVE) ×2
GLOVE ORTHO TXT STRL SZ7.5 (GLOVE) ×3 IMPLANT
GOWN PREVENTION PLUS XLARGE (GOWN DISPOSABLE) ×6 IMPLANT
GOWN PREVENTION PLUS XXLARGE (GOWN DISPOSABLE) ×3 IMPLANT
IMMOBILIZER SHOULDER XLGE (ORTHOPEDIC SUPPLIES) IMPLANT
KIT SHOULDER TRACTION (DRAPES) ×3 IMPLANT
NDL SUT 6 .5 CRC .975X.05 MAYO (NEEDLE) IMPLANT
NEEDLE MAYO TAPER (NEEDLE)
NEEDLE SCORPION MULTI FIRE (NEEDLE) IMPLANT
PACK ARTHROSCOPY DSU (CUSTOM PROCEDURE TRAY) ×3 IMPLANT
PACK BASIN DAY SURGERY FS (CUSTOM PROCEDURE TRAY) ×3 IMPLANT
PENCIL BUTTON HOLSTER BLD 10FT (ELECTRODE) IMPLANT
SET ARTHROSCOPY TUBING (MISCELLANEOUS) ×1
SET ARTHROSCOPY TUBING LN (MISCELLANEOUS) ×2 IMPLANT
SHEET MEDIUM DRAPE 40X70 STRL (DRAPES) ×3 IMPLANT
SLEEVE SCD COMPRESS KNEE MED (MISCELLANEOUS) ×3 IMPLANT
SLING ARM FOAM STRAP LRG (SOFTGOODS) IMPLANT
SLING ARM FOAM STRAP MED (SOFTGOODS) IMPLANT
SLING ARM FOAM STRAP XLG (SOFTGOODS) ×3 IMPLANT
SLING ARM IMMOBILIZER LRG (SOFTGOODS) IMPLANT
SLING ARM IMMOBILIZER MED (SOFTGOODS) IMPLANT
SPONGE GAUZE 4X4 12PLY (GAUZE/BANDAGES/DRESSINGS) ×3 IMPLANT
STRIP CLOSURE SKIN 1/2X4 (GAUZE/BANDAGES/DRESSINGS) ×3 IMPLANT
SUCTION FRAZIER TIP 10 FR DISP (SUCTIONS) IMPLANT
SUT FIBERWIRE #2 38 T-5 BLUE (SUTURE)
SUT MNCRL AB 3-0 PS2 18 (SUTURE) IMPLANT
SUT MNCRL AB 4-0 PS2 18 (SUTURE) ×3 IMPLANT
SUT TIGER TAPE 7 IN WHITE (SUTURE) IMPLANT
SUT VIC AB 3-0 SH 27 (SUTURE)
SUT VIC AB 3-0 SH 27X BRD (SUTURE) IMPLANT
SUT VICRYL 3-0 CR8 SH (SUTURE) IMPLANT
SUTURE FIBERWR #2 38 T-5 BLUE (SUTURE) IMPLANT
TAPE CLOTH SURG 6X10 WHT LF (GAUZE/BANDAGES/DRESSINGS) ×3 IMPLANT
TOWEL OR 17X24 6PK STRL BLUE (TOWEL DISPOSABLE) ×3 IMPLANT
TOWEL OR NON WOVEN STRL DISP B (DISPOSABLE) ×3 IMPLANT
TUBE CONNECTING 20X1/4 (TUBING) ×3 IMPLANT
WAND STAR VAC 90 (SURGICAL WAND) ×3 IMPLANT
WATER STERILE IRR 1000ML POUR (IV SOLUTION) ×3 IMPLANT

## 2011-02-25 NOTE — Anesthesia Postprocedure Evaluation (Signed)
  Anesthesia Post-op Note  Patient: [name]  Procedure(s) Performed:  ARTHROSCOPY SHOULDER - arthroscopy left shoulder, Debridement of Labral Tear; STEROID INJECTION - Bilateral wrist injections for carpal tunnel syndrome  Patient Location: PACU  Anesthesia Type: General  Level of Consciousness: awake  Airway and Oxygen Therapy: Patient Spontanous Breathing  Post-op Pain: mild  Post-op Assessment: Post-op Vital signs reviewed and Patient's Cardiovascular Status Stable  Post-op Vital Signs: stable  Complications: No apparent anesthesia complications

## 2011-02-25 NOTE — Op Note (Signed)
02/25/2011  8:43 AM  PATIENT:  [name]  45 y.o. male  PRE-OPERATIVE DIAGNOSIS:  Left shoulder impingement, bilateral carpal tunnel syndrome  POST-OPERATIVE DIAGNOSIS:  Left shoulder labral tear, bilateral carpal tunnel syndrome  PROCEDURE:  ARTHROSCOPY SHOULDER, debridement of labrum and bursectomy, injection bilateral carpal tunnel  SURGEON:  Guillermo Difrancesco P  PHYSICIAN ASSISTANT: Janace Litten, OPA-C  ANESTHESIA:   General   Operative findings: There was a type II superior labral tear that extended around anteriorly. The rotator cuff had a 10% undersurface tear. The subscapularis and middle glenohumeral ligament was normal. There was a small amount of chondral injury on the anterior humeral head, of unclear etiology. The rest of the cartilage was normal. The infraspinatus is intact.  From the bursal side the a.c. joint had no evidence for hypertrophy, and the undersurface of the acromion was completely smoothed. There was absolutely no fraying of the CA ligament, and therefore I did not perform an acromioplasty. There was some slightly hypertrophied bursal tissue which was excised, and the rotator cuff was completely intact.   Preoperative indications: [name] is a 45 year old gentleman who had ongoing severe left shoulder pain. He elected for surgical management after failing conservative treatment. The risks benefits and alternatives were discussed at length.  The risks benefits and alternatives were discussed with the patient including but not limited to the risks of nonoperative treatment, versus surgical intervention including infection, bleeding, nerve injury, blood clots, cardiopulmonary complications, morbidity, mortality, among others, and they were willing to proceed.  Predicted outcome is good, although there will be at least a six to nine month expected recovery.  Operative procedure: The patient was brought to the operating room and placed in the supine  position. Gen. anesthesia was administered. IV antibiotics were given. Time out was performed. Both palms were prepped with Betadine and injected with 4 cc of Xylocaine and 1 cc of Depo-Medrol.  Examination under anesthesia demonstrated full motion of the right shoulder. The right upper time he was prepped and draped in usual sterile fashion in a semilateral decubitus position.  Repeat time out was performed, and the right upper extremity was suspended in 10 pounds of traction. Diagnostic arthroscopy was carried out.  The above-named name findings were noted. The arthroscopic shaver was introduced and the labrum was debrided back to a stable rim. This was well anchored to the insertion on the supraglenoid tubercle. The biceps anchor was stable. After debridement of the labrum and debridement of the undersurface of the rotator cuff, I went to the subacromial space, and this was remarkably good-looking. Therefore I did not perform an acromioplasty or or a coracoacromial release.  The shoulder was then irrigated copiously, and the portals closed with Monocryl followed by Steri-Strips and sterile gauze. He was awakened and returned to PACU in stable and satisfactory condition. There no complications.

## 2011-02-25 NOTE — H&P (Signed)
  PREOPERATIVE H&P  Chief Complaint: Left shoulder pain  HPI: [name] is a 45 y.o. male who presents for evaluation of left shoulder pain. It has been present for months and has been worsening. He has failed conservative measures. Pain is rated as severe. It is located directly over left shoulder, worse with overhead motion. He also complains of ongoing left sided carpal tunnel syndrome with numbness and tingling in his hand. He has responded in the past her injections and would like another injection.  Past Medical History  Diagnosis Date  . GERD (gastroesophageal reflux disease)   . ETOH abuse   . Bipolar 1 disorder    Past Surgical History  Procedure Date  . Mandible fracture surgery 3/12  . Cardiac catheterization 2004    normal   History   Social History  . Marital Status: Divorced    Spouse Name: N/A    Number of Children: N/A  . Years of Education: N/A   Social History Main Topics  . Smoking status: Current Everyday Smoker -- 2.0 packs/day for 25 years    Types: Cigarettes  . Smokeless tobacco: None  . Alcohol Use: No  . Drug Use: No  . Sexually Active:    Other Topics Concern  . None   Social History Narrative  . None   History reviewed. No pertinent family history. Allergies  Allergen Reactions  . Trazamine (Trazodone & Diet Manage Prod)     Patient states he is not allergic, but does not like to take due to nightmares   Prior to Admission medications   Medication Sig Start Date End Date Taking? Authorizing Provider  omeprazole (PRILOSEC) 20 MG capsule Take 20 mg by mouth daily.     Yes Historical Provider, MD  traMADol (ULTRAM) 50 MG tablet Take 50 mg by mouth every 6 (six) hours as needed. Maximum dose= 8 tablets per day    Yes Historical Provider, MD     Positive ROS:  All other systems have been reviewed and were otherwise negative with the exception of those mentioned in the HPI and as above.  Physical Exam: Filed Vitals:   02/25/11  0627  BP: 134/93  Pulse: 78  Temp: 98 F (36.7 C)  Resp: 18    General: Alert, no acute distress Cardiovascular: No pedal edema Respiratory: No cyanosis, no use of accessory musculature GI: No organomegaly, abdomen is soft and non-tender Skin: No lesions in the area of chief complaint Neurologic: Sensation intact distally Psychiatric: Patient is competent for consent with normal mood and affect Lymphatic: No axillary or cervical lymphadenopathy  MUSCULOSKELETAL: Left shoulder has positive pain with forward flexion, as well as mild weakness with supraspinatus testing. He does not have pain over his a.c. Joint.  Assessment/Plan: impingement left shoulder, and carpal tunnel syndrome, left hand.   Plan for Procedure(s): ARTHROSCOPY SHOULDER with acromioplasty and bursectomy and debridement and injection of left carpal tunnel  The risks benefits and alternatives were discussed with the patient including but not limited to the risks of nonoperative treatment, versus surgical intervention including infection, bleeding, nerve injury, malunion, nonunion, hardware prominence, hardware failure, need for hardware removal, blood clots, cardiopulmonary complications, morbidity, mortality, among others, and they were willing to proceed.  Predicted outcome is good, although there will be at least a six to nine month expected recovery.  Lakethia Coppess P 02/25/2011 7:23 AM

## 2011-02-25 NOTE — Progress Notes (Signed)
0930 pt c/o needing to use urinal -- got urinal and pt said he could not use it with one hand --- got his girlfriend to the bedside and he was fusing at her. He voided 400 cc's of clear yellow urine. Kept on stating he was mad.  0945 pt moved to discharge --- girlfriend stated "he really is a nice guy!"

## 2011-02-25 NOTE — Anesthesia Procedure Notes (Signed)
Procedure Name: Intubation Performed by: Sharyne Richters Pre-anesthesia Checklist: Patient identified, Emergency Drugs available, Suction available, Patient being monitored and Timeout performed Patient Re-evaluated:Patient Re-evaluated prior to inductionOxygen Delivery Method: Circle System Utilized Preoxygenation: Pre-oxygenation with 100% oxygen Intubation Type: IV induction Ventilation: Two handed mask ventilation required Grade View: Grade I Tube type: Oral Tube size: 8.0 mm Number of attempts: 1 Airway Equipment and Method: video-laryngoscopy Placement Confirmation: ETT inserted through vocal cords under direct vision,  breath sounds checked- equal and bilateral and positive ETCO2 Secured at: 22 cm Tube secured with: Tape Dental Injury: Teeth and Oropharynx as per pre-operative assessment

## 2011-02-25 NOTE — Progress Notes (Signed)
0900 on admission to PACU pt c/o needing to poop -- refused bedpan when offered. Place pillow under left arm -- screamed and fussed that I was pulling under his arm -- explained that I put a pillow under his left arm gently tp support his arm. Next he tried to get up said he was going to get out of here-- explained  - you are in recovery. Constantly asking for ice chips and complaining of right hand hurting when he asked me for washcloth to wipe his eyes.

## 2011-02-25 NOTE — Transfer of Care (Signed)
Immediate Anesthesia Transfer of Care Note  Patient: [name]  Procedure(s) Performed:  ARTHROSCOPY SHOULDER - arthroscopy left shoulder, Debridement of Labral Tear; STEROID INJECTION - Bilateral wrist injections for carpal tunnel syndrome  Patient Location: PACU  Anesthesia Type: General  Level of Consciousness: awake, alert  and oriented  Airway & Oxygen Therapy: Patient Spontanous Breathing and Patient connected to face mask oxygen  Post-op Assessment: Report given to PACU RN and Post -op Vital signs reviewed and stable  Post vital signs: Reviewed and stable  Complications: No apparent anesthesia complications

## 2011-02-25 NOTE — Anesthesia Preprocedure Evaluation (Addendum)
Anesthesia Evaluation  Patient identified by MRN, date of birth, ID band Patient awake    Reviewed: Allergy & Precautions, H&P , NPO status   Airway Mallampati: II TM Distance: >3 FB     Dental  (+) Teeth Intact,    Pulmonary Current Smoker,  clear to auscultation        Cardiovascular hypertension, Regular Normal    Neuro/Psych    GI/Hepatic   Endo/Other    Renal/GU      Musculoskeletal   Abdominal (+)  Abdomen: soft.    Peds  Hematology   Anesthesia Other Findings   Reproductive/Obstetrics                          Anesthesia Physical Anesthesia Plan  ASA: III  Anesthesia Plan: General   Post-op Pain Management:    Induction: Intravenous  Airway Management Planned: Oral ETT  Additional Equipment:   Intra-op Plan:   Post-operative Plan: Extubation in OR  Informed Consent: I have reviewed the patients History and Physical, chart, labs and discussed the procedure including the risks, benefits and alternatives for the proposed anesthesia with the patient or authorized representative who has indicated his/her understanding and acceptance.   Dental advisory given  Plan Discussed with: CRNA and Surgeon  Anesthesia Plan Comments: (Impingement L. Shoulder H/O Bipolar Disorder Smoker Borderline htn on no meds Mild obesity H/O Etoh abuse  Plan GA with interscalene block. )        Anesthesia Quick Evaluation

## 2011-03-02 ENCOUNTER — Encounter (HOSPITAL_BASED_OUTPATIENT_CLINIC_OR_DEPARTMENT_OTHER): Payer: Self-pay | Admitting: Orthopedic Surgery

## 2011-03-10 ENCOUNTER — Other Ambulatory Visit: Payer: Self-pay | Admitting: Orthopedic Surgery

## 2011-03-10 ENCOUNTER — Encounter (HOSPITAL_BASED_OUTPATIENT_CLINIC_OR_DEPARTMENT_OTHER): Payer: Self-pay | Admitting: *Deleted

## 2011-03-10 NOTE — Progress Notes (Signed)
Here 11/12 for lt shoulder Still numbness lt hand Arrive with girlfriend 9am

## 2011-03-11 ENCOUNTER — Encounter (HOSPITAL_BASED_OUTPATIENT_CLINIC_OR_DEPARTMENT_OTHER): Payer: Self-pay | Admitting: Anesthesiology

## 2011-03-11 ENCOUNTER — Ambulatory Visit (HOSPITAL_BASED_OUTPATIENT_CLINIC_OR_DEPARTMENT_OTHER): Payer: Managed Care, Other (non HMO) | Admitting: Anesthesiology

## 2011-03-11 ENCOUNTER — Encounter (HOSPITAL_BASED_OUTPATIENT_CLINIC_OR_DEPARTMENT_OTHER): Payer: Self-pay | Admitting: Orthopedic Surgery

## 2011-03-11 ENCOUNTER — Encounter (HOSPITAL_BASED_OUTPATIENT_CLINIC_OR_DEPARTMENT_OTHER)
Admission: RE | Disposition: A | Discharge status: Home / Self Care | Payer: Self-pay | Source: Ambulatory Visit | Attending: Orthopedic Surgery

## 2011-03-11 ENCOUNTER — Ambulatory Visit (HOSPITAL_BASED_OUTPATIENT_CLINIC_OR_DEPARTMENT_OTHER)
Admission: RE | Admit: 2011-03-11 | Discharge: 2011-03-11 | Disposition: A | Discharge status: Home / Self Care | Payer: Managed Care, Other (non HMO) | Source: Ambulatory Visit | Attending: Orthopedic Surgery | Admitting: Orthopedic Surgery

## 2011-03-11 ENCOUNTER — Encounter (HOSPITAL_BASED_OUTPATIENT_CLINIC_OR_DEPARTMENT_OTHER): Payer: Self-pay | Admitting: *Deleted

## 2011-03-11 DIAGNOSIS — G5602 Carpal tunnel syndrome, left upper limb: Secondary | ICD-10-CM

## 2011-03-11 DIAGNOSIS — G5600 Carpal tunnel syndrome, unspecified upper limb: Secondary | ICD-10-CM | POA: Insufficient documentation

## 2011-03-11 DIAGNOSIS — F319 Bipolar disorder, unspecified: Secondary | ICD-10-CM | POA: Insufficient documentation

## 2011-03-11 DIAGNOSIS — K219 Gastro-esophageal reflux disease without esophagitis: Secondary | ICD-10-CM | POA: Insufficient documentation

## 2011-03-11 HISTORY — DX: Carpal tunnel syndrome, left upper limb: G56.02

## 2011-03-11 HISTORY — DX: Myoneural disorder, unspecified: G70.9

## 2011-03-11 HISTORY — PX: CARPAL TUNNEL RELEASE: SHX101

## 2011-03-11 SURGERY — CARPAL TUNNEL RELEASE
Anesthesia: General | Site: Hand | Laterality: Left | Wound class: Clean

## 2011-03-11 MED ORDER — PROMETHAZINE HCL 25 MG PO TABS: 25.0000 mg | ORAL_TABLET | Freq: Four times a day (QID) | ORAL | Status: AC | PRN

## 2011-03-11 MED ORDER — HYDROMORPHONE HCL PF 1 MG/ML IJ SOLN
0.2500 mg | INTRAMUSCULAR | Status: DC | PRN
  Administered 2011-03-11 (×2): 0.5 mg via INTRAVENOUS

## 2011-03-11 MED ORDER — MIDAZOLAM HCL 5 MG/5ML IJ SOLN
INTRAMUSCULAR | Status: DC | PRN
  Administered 2011-03-11: 2 mg via INTRAVENOUS

## 2011-03-11 MED ORDER — ONDANSETRON HCL 4 MG/2ML IJ SOLN
INTRAMUSCULAR | Status: DC | PRN
  Administered 2011-03-11: 4 mg via INTRAVENOUS

## 2011-03-11 MED ORDER — PROPOFOL 10 MG/ML IV EMUL
INTRAVENOUS | Status: DC | PRN
  Administered 2011-03-11: 200 mg via INTRAVENOUS

## 2011-03-11 MED ORDER — LACTATED RINGERS IV SOLN
INTRAVENOUS | Status: DC
  Administered 2011-03-11 (×3): via INTRAVENOUS

## 2011-03-11 MED ORDER — FENTANYL CITRATE 0.05 MG/ML IJ SOLN
INTRAMUSCULAR | Status: DC | PRN
  Administered 2011-03-11: 100 ug via INTRAVENOUS

## 2011-03-11 MED ORDER — DROPERIDOL 2.5 MG/ML IJ SOLN: 0.6250 mg | INTRAMUSCULAR | Status: DC | PRN

## 2011-03-11 MED ORDER — LIDOCAINE HCL (CARDIAC) 20 MG/ML IV SOLN
INTRAVENOUS | Status: DC | PRN
  Administered 2011-03-11: 100 mg via INTRAVENOUS

## 2011-03-11 MED ORDER — MIDAZOLAM HCL 2 MG/2ML IJ SOLN: 2.0000 mg | Freq: Once | INTRAMUSCULAR | Status: DC

## 2011-03-11 MED ORDER — CHLORHEXIDINE GLUCONATE 4 % EX LIQD: 60.0000 mL | Freq: Once | CUTANEOUS | Status: DC

## 2011-03-11 MED ORDER — DEXAMETHASONE SODIUM PHOSPHATE 4 MG/ML IJ SOLN
INTRAMUSCULAR | Status: DC | PRN
  Administered 2011-03-11: 10 mg via INTRAVENOUS

## 2011-03-11 MED ORDER — BUPIVACAINE HCL (PF) 0.5 % IJ SOLN
INTRAMUSCULAR | Status: DC | PRN
  Administered 2011-03-11: 9 mL

## 2011-03-11 MED ORDER — FENTANYL CITRATE 0.05 MG/ML IJ SOLN: 100.0000 ug | Freq: Once | INTRAMUSCULAR | Status: DC

## 2011-03-11 SURGICAL SUPPLY — 40 items
BANDAGE ELASTIC 3 VELCRO ST LF (GAUZE/BANDAGES/DRESSINGS) ×2 IMPLANT
BLADE SURG 15 STRL LF DISP TIS (BLADE) ×1 IMPLANT
BLADE SURG 15 STRL SS (BLADE) ×1
BNDG ESMARK 4X9 LF (GAUZE/BANDAGES/DRESSINGS) ×2 IMPLANT
CLOTH BEACON ORANGE TIMEOUT ST (SAFETY) ×2 IMPLANT
CORDS BIPOLAR (ELECTRODE) ×2 IMPLANT
COVER TABLE BACK 60X90 (DRAPES) ×2 IMPLANT
CUFF TOURNIQUET SINGLE 18IN (TOURNIQUET CUFF) ×2 IMPLANT
DRAPE EXTREMITY T 121X128X90 (DRAPE) ×2 IMPLANT
DRAPE SURG 17X23 STRL (DRAPES) ×2 IMPLANT
DRAPE U 20/CS (DRAPES) ×2 IMPLANT
DURAPREP 26ML APPLICATOR (WOUND CARE) ×2 IMPLANT
GAUZE SPONGE 4X4 12PLY STRL LF (GAUZE/BANDAGES/DRESSINGS) ×2 IMPLANT
GLOVE BIO SURGEON STRL SZ 6.5 (GLOVE) ×4 IMPLANT
GLOVE BIO SURGEON STRL SZ8 (GLOVE) IMPLANT
GLOVE BIOGEL PI IND STRL 7.0 (GLOVE) ×1 IMPLANT
GLOVE BIOGEL PI IND STRL 8 (GLOVE) ×1 IMPLANT
GLOVE BIOGEL PI INDICATOR 7.0 (GLOVE) ×1
GLOVE BIOGEL PI INDICATOR 8 (GLOVE) ×1
GLOVE ORTHO TXT STRL SZ7.5 (GLOVE) ×2 IMPLANT
GOWN PREVENTION PLUS XLARGE (GOWN DISPOSABLE) ×4 IMPLANT
NEEDLE HYPO 25X1 1.5 SAFETY (NEEDLE) ×2 IMPLANT
NS IRRIG 1000ML POUR BTL (IV SOLUTION) ×2 IMPLANT
PACK BASIN DAY SURGERY FS (CUSTOM PROCEDURE TRAY) ×2 IMPLANT
PAD CAST 3X4 CTTN HI CHSV (CAST SUPPLIES) ×1 IMPLANT
PADDING CAST ABS 3INX4YD NS (CAST SUPPLIES)
PADDING CAST ABS 4INX4YD NS (CAST SUPPLIES) ×1
PADDING CAST ABS COTTON 3X4 (CAST SUPPLIES) IMPLANT
PADDING CAST ABS COTTON 4X4 ST (CAST SUPPLIES) ×1 IMPLANT
PADDING CAST COTTON 3X4 STRL (CAST SUPPLIES) ×1
SPLINT PLASTER CAST XFAST 3X15 (CAST SUPPLIES) IMPLANT
SPLINT PLASTER XTRA FASTSET 3X (CAST SUPPLIES)
SPONGE GAUZE 4X4 12PLY (GAUZE/BANDAGES/DRESSINGS) ×2 IMPLANT
STOCKINETTE 4X48 STRL (DRAPES) ×2 IMPLANT
SUT ETHILON 4 0 PS 2 18 (SUTURE) ×2 IMPLANT
SYR BULB 3OZ (MISCELLANEOUS) ×2 IMPLANT
SYR CONTROL 10ML LL (SYRINGE) ×2 IMPLANT
TOWEL OR 17X24 6PK STRL BLUE (TOWEL DISPOSABLE) ×2 IMPLANT
UNDERPAD 30X30 INCONTINENT (UNDERPADS AND DIAPERS) ×2 IMPLANT
WATER STERILE IRR 1000ML POUR (IV SOLUTION) ×2 IMPLANT

## 2011-03-11 NOTE — Progress Notes (Addendum)
03/11/2011 1430 Spoke with Dr. Dion Saucier on the phone -- pt wanted another script for pain --- was short -tempered with Lennox Pippins Rn and wanted to go outside to smoke--- Mardella Layman told his girlfriend to go with him-- pt sitting on bench in front of [facility]. Dr. Dion Saucier said he would not give him another script for pain medication because he just gave him a script less than a week ago  -- percocet ( 75 tabs) for pain in the office. Explained the above to pt -- he said [facility] and that he was worried about not having any  Insurance and if it would pay for his prescription. Asked pt is he got the script filled -- he said."yes!" Told him it should be enough and that if he had any problems to call Dr. Dion Saucier at the office. Pt left with cigarette in his mouth-- not lit-- explained we are tobacco free facility.

## 2011-03-11 NOTE — Anesthesia Preprocedure Evaluation (Signed)
Anesthesia Evaluation  Patient identified by MRN, date of birth, ID band Patient awake    Airway       Dental   Pulmonary neg pulmonary ROS,  clear to auscultation  Pulmonary exam normal       Cardiovascular neg cardio ROS Regular Normal- Systolic murmurs    Neuro/Psych PSYCHIATRIC DISORDERS Bipolar Disorder  Neuromuscular disease    GI/Hepatic Neg liver ROS, GERD-  Controlled,  Endo/Other  Morbid obesity  Renal/GU negative Renal ROS     Musculoskeletal   Abdominal   Peds  Hematology   Anesthesia Other Findings   Reproductive/Obstetrics                           Anesthesia Physical Anesthesia Plan  ASA: III  Anesthesia Plan: General   Post-op Pain Management:    Induction: Intravenous  Airway Management Planned: LMA  Additional Equipment:   Intra-op Plan:   Post-operative Plan: Extubation in OR  Informed Consent: I have reviewed the patients History and Physical, chart, labs and discussed the procedure including the risks, benefits and alternatives for the proposed anesthesia with the patient or authorized representative who has indicated his/her understanding and acceptance.     Plan Discussed with: CRNA and Anesthesiologist  Anesthesia Plan Comments:         Anesthesia Quick Evaluation

## 2011-03-11 NOTE — Transfer of Care (Signed)
Immediate Anesthesia Transfer of Care Note  Patient: [name]  Procedure(s) Performed:  CARPAL TUNNEL RELEASE  Patient Location: PACU  Anesthesia Type: General  Level of Consciousness: awake, alert  and oriented  Airway & Oxygen Therapy: Patient Spontanous Breathing and Patient connected to face mask oxygen  Post-op Assessment: Report given to PACU RN and Post -op Vital signs reviewed and stable  Post vital signs: Reviewed and stable  Complications: No apparent anesthesia complications

## 2011-03-11 NOTE — Op Note (Signed)
03/11/2011  12:46 PM  PATIENT:  [name]    PRE-OPERATIVE DIAGNOSIS:  Left Carpal Tunnel Syndrome  POST-OPERATIVE DIAGNOSIS:  Same  PROCEDURE:  CARPAL TUNNEL RELEASE  SURGEON:  Oriah Leinweber P  ANESTHESIA:   General  PREOPERATIVE INDICATIONS:  [name] is a  45 y.o. male with a diagnosis of Left Carpal Tunnel Syndrome who failed conservative measures and elected for surgical management.  He had multiple previous injections, as well as wrist splints, and had I nerve conduction study documenting moderate to severe carpal tunnel syndrome on the left side. The injections were only short-term relief, and he elected for surgical carpal tunnel release.  The risks benefits and alternatives were discussed with the patient preoperatively including but not limited to the risks of infection, bleeding, nerve injury, cardiopulmonary complications, the need for revision surgery, among others, and the patient was willing to proceed.  OPERATIVE FINDINGS: Minimal nerve atrophy.  OPERATIVE PROCEDURE: The patient is brought to the operating room placed in supine position. General anesthesia was administered. The left upper extremity is prepped and draped in usual sterile fashion. The left arm was elevated and exsanguinated and the tourniquet was inflated. Total tourniquet time was approximately 20 minutes. After time out was performed volar incision was made over the carpal tunnel. This was in line with the radial border of the fourth finger.  Dissection was carried down and the transverse carpal ligament identified and incised sharply. The nerve was protected below. Complete release was performed all the way up to the volar fascia of the forearm. The nerve was intact, and did not have substantial hemorrhage or atrophy.  The wounds were irrigated copiously, injected with Marcaine, and the skin repaired with nylon followed by sterile gauze and a volar splint. The tourniquet was released, and the  patient was awakened and returned to PACU in stable and satisfactory condition. There no complications.

## 2011-03-11 NOTE — H&P (Signed)
PREOPERATIVE H&P  Chief Complaint: Left carpal tunnel syndrome  HPI: [name] is a 45 y.o. male who presents for preoperative history and physical with a diagnosis of left carpal tunnel syndrome. Symptoms are rated as moderate to severe, and have been worsening.  This is significantly impairing activities of daily living.  He has failed conservative measures, and has elected for surgical management. His had previous nerve conduction studies which demonstrate moderate to severe carpal tunnel syndrome. He is also had injections which provided temporary relief.  Past Medical History  Diagnosis Date  . GERD (gastroesophageal reflux disease)   . ETOH abuse   . Bipolar 1 disorder   . Neuromuscular disorder     numbness lt hand   Past Surgical History  Procedure Date  . Mandible fracture surgery 3/12  . Cardiac catheterization 2004    normal  . Shoulder arthroscopy 02/25/2011    Procedure: ARTHROSCOPY SHOULDER;  Surgeon: Eulas Post;  Location: [facility];  Service: Orthopedics;  Laterality: Left;  arthroscopy left shoulder, Debridement of Labral Tear  . Steriod injection 02/25/2011    Procedure: STEROID INJECTION;  Surgeon: Eulas Post;  Location: [facility];  Service: Orthopedics;  Laterality: Bilateral;  Bilateral wrist injections for carpal tunnel syndrome   History   Social History  . Marital Status: Divorced    Spouse Name: N/A    Number of Children: N/A  . Years of Education: N/A   Social History Main Topics  . Smoking status: Current Everyday Smoker -- 2.0 packs/day for 25 years    Types: Cigarettes  . Smokeless tobacco: None  . Alcohol Use: No  . Drug Use: No  . Sexually Active:    Other Topics Concern  . None   Social History Narrative  . None   History reviewed. No pertinent family history. Allergies  Allergen Reactions  . Trazamine (Trazodone & Diet Manage Prod)     Patient states he is not allergic, but does not  like to take due to nightmares   Prior to Admission medications   Medication Sig Start Date End Date Taking? Authorizing Provider  oxyCODONE-acetaminophen (PERCOCET) 5-325 MG per tablet Take 1 tablet by mouth every 4 (four) hours as needed.     Yes Historical Provider, MD  omeprazole (PRILOSEC) 20 MG capsule Take 20 mg by mouth daily.      Historical Provider, MD  traMADol (ULTRAM) 50 MG tablet Take 50 mg by mouth every 6 (six) hours as needed. Maximum dose= 8 tablets per day     Historical Provider, MD     Positive ROS:   All other systems have been reviewed and were otherwise negative with the exception of those mentioned in the HPI and as above.  Physical Exam: There were no vitals filed for this visit.  General: Alert, no acute distress Cardiovascular: No pedal edema Respiratory: No cyanosis, no use of accessory musculature GI: No organomegaly, abdomen is soft and non-tender Skin: No lesions in the area of chief complaint Neurologic: Sensation intact distally Psychiatric: Patient is competent for consent with normal mood and affect Lymphatic: No axillary or cervical lymphadenopathy  MUSCULOSKELETAL: Positive Phalen's test on the left side, no thenar atrophy, decreased subjective sensation in the median nerve distribution.  Assessment/Plan: Left carpal tunnel syndrome Plan for Procedure(s): CARPAL TUNNEL RELEASE  The risks benefits and alternatives were discussed with the patient including but not limited to the risks of nonoperative treatment, versus surgical intervention including infection, bleeding,  nerve injury,  blood clots, cardiopulmonary complications, morbidity, mortality, among others, and they were willing to proceed. Predicted outcome is good, although there will be at least a six to nine month expected recovery.  [name] P 03/11/2011 7:41 AM

## 2011-03-11 NOTE — Anesthesia Procedure Notes (Addendum)
Procedure Name: LMA Insertion Date/Time: 03/11/2011 12:12 PM Performed by: Zenia Resides D Pre-anesthesia Checklist: Patient identified, Emergency Drugs available, Suction available and Patient being monitored Patient Re-evaluated:Patient Re-evaluated prior to inductionOxygen Delivery Method: Circle System Utilized Preoxygenation: Pre-oxygenation with 100% oxygen Intubation Type: IV induction Ventilation: Mask ventilation without difficulty LMA: LMA with gastric port inserted LMA Size: 5.0 Number of attempts: 1 Placement Confirmation: positive ETCO2 and breath sounds checked- equal and bilateral Tube secured with: Tape Dental Injury: Teeth and Oropharynx as per pre-operative assessment

## 2011-03-11 NOTE — Anesthesia Postprocedure Evaluation (Signed)
Anesthesia Post Note  Patient: [name]  Procedure(s) Performed:  CARPAL TUNNEL RELEASE  Anesthesia type: General  Patient location: PACU  Post pain: Pain level controlled  Post assessment: Patient's Cardiovascular Status Stable  Last Vitals:  Filed Vitals:   03/11/11 1330  BP: 121/78  Pulse: 78  Temp:   Resp: 12    Post vital signs: Reviewed and stable  Level of consciousness: sedated  Complications: No apparent anesthesia complications

## 2011-03-15 ENCOUNTER — Encounter (HOSPITAL_BASED_OUTPATIENT_CLINIC_OR_DEPARTMENT_OTHER): Payer: Self-pay | Admitting: Orthopedic Surgery

## 2011-08-09 ENCOUNTER — Emergency Department (HOSPITAL_COMMUNITY): Payer: Self-pay

## 2011-08-09 ENCOUNTER — Emergency Department (HOSPITAL_COMMUNITY)
Admission: EM | Admit: 2011-08-09 | Discharge: 2011-08-09 | Disposition: A | Discharge status: Home / Self Care | Payer: Self-pay | Attending: Emergency Medicine | Admitting: Emergency Medicine

## 2011-08-09 ENCOUNTER — Encounter (HOSPITAL_COMMUNITY): Payer: Self-pay | Admitting: Emergency Medicine

## 2011-08-09 DIAGNOSIS — M25561 Pain in right knee: Secondary | ICD-10-CM

## 2011-08-09 DIAGNOSIS — F319 Bipolar disorder, unspecified: Secondary | ICD-10-CM | POA: Insufficient documentation

## 2011-08-09 DIAGNOSIS — M25569 Pain in unspecified knee: Secondary | ICD-10-CM | POA: Insufficient documentation

## 2011-08-09 DIAGNOSIS — K219 Gastro-esophageal reflux disease without esophagitis: Secondary | ICD-10-CM | POA: Insufficient documentation

## 2011-08-09 MED ORDER — KETOROLAC TROMETHAMINE 60 MG/2ML IM SOLN
60.0000 mg | Freq: Once | INTRAMUSCULAR | Status: AC
  Administered 2011-08-09: 60 mg via INTRAMUSCULAR
  Filled 2011-08-09: qty 2

## 2011-08-09 MED ORDER — OXYCODONE-ACETAMINOPHEN 5-325 MG PO TABS: 1.0000 | ORAL_TABLET | Freq: Four times a day (QID) | ORAL | Status: AC | PRN

## 2011-08-09 MED ORDER — OXYCODONE-ACETAMINOPHEN 5-325 MG PO TABS
1.0000 | ORAL_TABLET | Freq: Once | ORAL | Status: AC
  Administered 2011-08-09: 1 via ORAL
  Filled 2011-08-09: qty 1

## 2011-08-09 NOTE — ED Provider Notes (Signed)
History     CSN: [id number]  Arrival date & time 08/09/11  1511   First MD Initiated Contact with Patient 08/09/11 1601      Chief Complaint  Patient presents with  . Knee Injury    (Consider location/radiation/quality/duration/timing/severity/associated sxs/prior treatment) The history is provided by the patient.   46 y/o M presents to ED with c/c of right knee pain for the last couple of weeks. NKI. Pain worst to the lateral knee. Throbbing, waxing and waning, 10/10 at its worst. Occasionally radiates down the anterior lower leg. Increases with excessive activity as well as prolonged periods of immobility. Ambulating short distances sometimes relieves the pain. Morning stiffness present. Denies associated swelling. Able to ambulate with sig pain. Has been taking naproxen for pain, which initially was helpful but is no longer having any noticeable effect. Has also been applying ice and compression intermittently without change. No prior eval for this pain. Pt has seen Dr Dion Saucier in the past for orthopedic complaints but was unhappy with his care and would like to see a different doctor.  Past Medical History  Diagnosis Date  . GERD (gastroesophageal reflux disease)   . ETOH abuse   . Bipolar 1 disorder   . Neuromuscular disorder     numbness lt hand  . Left carpal tunnel syndrome 03/11/2011    Past Surgical History  Procedure Date  . Mandible fracture surgery 3/12  . Cardiac catheterization 2004    normal  . Shoulder arthroscopy 02/25/2011    Procedure: ARTHROSCOPY SHOULDER;  Surgeon: Eulas Post;  Location: [facility];  Service: Orthopedics;  Laterality: Left;  arthroscopy left shoulder, Debridement of Labral Tear  . Steriod injection 02/25/2011    Procedure: STEROID INJECTION;  Surgeon: Eulas Post;  Location: [facility];  Service: Orthopedics;  Laterality: Bilateral;  Bilateral wrist injections for carpal tunnel syndrome  . Carpal tunnel  release 03/11/2011    Procedure: CARPAL TUNNEL RELEASE;  Surgeon: Eulas Post;  Location: [facility];  Service: Orthopedics;  Laterality: Left;    No family history on file.  History  Substance Use Topics  . Smoking status: Current Everyday Smoker -- 2.0 packs/day for 25 years    Types: Cigarettes  . Smokeless tobacco: Not on file  . Alcohol Use: No      Review of Systems  Constitutional: Negative for fever and chills.  Musculoskeletal:       See HPI, otherwise negative  Skin: Negative for color change and wound.  Neurological: Negative for weakness and numbness.    Allergies  Review of patient's allergies indicates no known allergies.  Home Medications   Current Outpatient Rx  Name Route Sig Dispense Refill  . IBUPROFEN 200 MG PO TABS Oral Take 200-400 mg by mouth every 6 (six) hours as needed. As needed for pain.    Marland Kitchen NAPROXEN SODIUM 220 MG PO TABS Oral Take 440 mg by mouth 2 (two) times daily as needed. As needed for pain.    Marland Kitchen OMEPRAZOLE 20 MG PO CPDR Oral Take 20 mg by mouth daily.        BP 156/105  Pulse 103  Temp(Src) 97.7 F (36.5 C) (Oral)  Resp 18  SpO2 96%  Physical Exam  Constitutional: He is oriented to person, place, and time. He appears well-developed and well-nourished. Distressed: uncomfortable appearing.       VS reviewed, sig for HTN  HENT:  Head: Normocephalic and atraumatic.  Eyes: Pupils  are equal, round, and reactive to light.  Neck: Neck supple.  Cardiovascular: Normal rate and regular rhythm.        Bilateral DP and PT pulses are 2+  Pulmonary/Chest: Effort normal. No respiratory distress.  Musculoskeletal: He exhibits edema. He exhibits no tenderness.       Right knee: He exhibits decreased range of motion (decreased flexion at 90 degrees, full extension present) and swelling (to inferiolateral aspect of knee). He exhibits no ecchymosis, no deformity, no laceration, no erythema, normal alignment, no LCL laxity, normal  patellar mobility, no bony tenderness and no MCL laxity. Abnormal meniscus: pain on varus stress. no tenderness found.       Increased pain elicited with passive ROM, not reproducible with palpation. Bilateral calves supple and non-tender. Strength in all major muscle groups in BLE tested and is 5/5 and symmetric, though pain with right knee flex/ext.   Remainder of MSK exam demonstrates no edema or tenderness to joints.  Neurological: He is alert and oriented to person, place, and time.  Skin: Skin is warm and dry. No erythema.  Psychiatric: He has a normal mood and affect.    ED Course  Procedures (including critical care time)  Labs Reviewed - No data to display Dg Knee Complete 4 Views Right  08/09/2011  *RADIOLOGY REPORT*  Clinical Data: 46 year old male with pain and unable to bear weight.  RIGHT KNEE - COMPLETE 4+ VIEW  Comparison: None.  Findings: Small suprapatellar joint effusion.  Joint spaces are preserved.  Patella intact. Bone mineralization is within normal limits.  No fracture or dislocation.  IMPRESSION: Joint effusion without acute fracture or dislocation.  Original Report Authenticated By: Ulla Potash III, M.D.     Dx 1: Right knee pain   MDM  Knee pain x 2 weeks with NKI. PE with findings of pain on movement, slight swelling, otherwise unremarkable. I personally viewed the x-ray images. No acute bony findings, though a small joint effusion is seen. As pain worse with prolonged immobility, a knee immobilizer is not appropriate in this patient. Advised to use ACE bandage (which he has at home) for compression. Also advised to limit ambulation- has crutches at home and refuses a new set today. I will give him the number of the on-call orthopedist for today for follow-up as his pain is persistent. Initial HTN improved after tx pain.        Shaaron Adler, [place] 08/09/11 1739

## 2011-08-09 NOTE — ED Notes (Addendum)
Pt reports having (R) knee pain x 2 weeks with increased pain today.  Pt non-tender on palpation, no obvious deformity, no bruising noted, no swelling.  Pt has difficulty walking due to pain.

## 2011-08-09 NOTE — Discharge Instructions (Signed)
As we discussed, your x-ray showed only a small amount of fluid on the knee. Try to stay off of the knee. Continue to apply ice and compression. You should continue to take naproxen or ibuprofen to help reduce inflammation. Elevate the leg when able.      Knee Pain The knee is the complex joint between your thigh and your lower leg. It is made up of bones, tendons, ligaments, and cartilage. The bones that make up the knee are:  The femur in the thigh.   The tibia and fibula in the lower leg.   The patella or kneecap riding in the groove on the lower femur.  CAUSES  Knee pain is a common complaint with many causes. A few of these causes are:  Injury, such as:   A ruptured ligament or tendon injury.   Torn cartilage.   Medical conditions, such as:   Gout   Arthritis   Infections   Overuse, over training or overdoing a physical activity.  Knee pain can be minor or severe. Knee pain can accompany debilitating injury. Minor knee problems often respond well to self-care measures or get well on their own. More serious injuries may need medical intervention or even surgery. SYMPTOMS The knee is complex. Symptoms of knee problems can vary widely. Some of the problems are:  Pain with movement and weight bearing.   Swelling and tenderness.   Buckling of the knee.   Inability to straighten or extend your knee.   Your knee locks and you cannot straighten it.   Warmth and redness with pain and fever.   Deformity or dislocation of the kneecap.  DIAGNOSIS  Determining what is wrong may be very straight forward such as when there is an injury. It can also be challenging because of the complexity of the knee. Tests to make a diagnosis may include:  Your caregiver taking a history and doing a physical exam.   Routine X-rays can be used to rule out other problems. X-rays will not reveal a cartilage tear. Some injuries of the knee can be diagnosed by:   Arthroscopy a surgical  technique by which a small video camera is inserted through tiny incisions on the sides of the knee. This procedure is used to examine and repair internal knee joint problems. Tiny instruments can be used during arthroscopy to repair the torn knee cartilage (meniscus).   Arthrography is a radiology technique. A contrast liquid is directly injected into the knee joint. Internal structures of the knee joint then become visible on X-ray film.   An MRI scan is a non x-ray radiology procedure in which magnetic fields and a computer produce two- or three-dimensional images of the inside of the knee. Cartilage tears are often visible using an MRI scanner. MRI scans have largely replaced arthrography in diagnosing cartilage tears of the knee.   Blood work.   Examination of the fluid that helps to lubricate the knee joint (synovial fluid). This is done by taking a sample out using a needle and a syringe.  TREATMENT The treatment of knee problems depends on the cause. Some of these treatments are:  Depending on the injury, proper casting, splinting, surgery or physical therapy care will be needed.   Give yourself adequate recovery time. Do not overuse your joints. If you begin to get sore during workout routines, back off. Slow down or do fewer repetitions.   For repetitive activities such as cycling or running, maintain your strength and nutrition.  Alternate muscle groups. For example if you are a weight lifter, work the upper body on one day and the lower body the next.   Either tight or weak muscles do not give the proper support for your knee. Tight or weak muscles do not absorb the stress placed on the knee joint. Keep the muscles surrounding the knee strong.   Take care of mechanical problems.   If you have flat feet, orthotics or special shoes may help. See your caregiver if you need help.   Arch supports, sometimes with wedges on the inner or outer aspect of the heel, can help. These can  shift pressure away from the side of the knee most bothered by osteoarthritis.   A brace called an "unloader" brace also may be used to help ease the pressure on the most arthritic side of the knee.   If your caregiver has prescribed crutches, braces, wraps or ice, use as directed. The acronym for this is PRICE. This means protection, rest, ice, compression and elevation.   Nonsteroidal anti-inflammatory drugs (NSAID's), can help relieve pain. But if taken immediately after an injury, they may actually increase swelling. Take NSAID's with food in your stomach. Stop them if you develop stomach problems. Do not take these if you have a history of ulcers, stomach pain or bleeding from the bowel. Do not take without your caregiver's approval if you have problems with fluid retention, heart failure, or kidney problems.   For ongoing knee problems, physical therapy may be helpful.   Glucosamine and chondroitin are over-the-counter dietary supplements. Both may help relieve the pain of osteoarthritis in the knee. These medicines are different from the usual anti-inflammatory drugs. Glucosamine may decrease the rate of cartilage destruction.   Injections of a corticosteroid drug into your knee joint may help reduce the symptoms of an arthritis flare-up. They may provide pain relief that lasts a few months. You may have to wait a few months between injections. The injections do have a small increased risk of infection, water retention and elevated blood sugar levels.   Hyaluronic acid injected into damaged joints may ease pain and provide lubrication. These injections may work by reducing inflammation. A series of shots may give relief for as long as 6 months.   Topical painkillers. Applying certain ointments to your skin may help relieve the pain and stiffness of osteoarthritis. Ask your pharmacist for suggestions. Many over the-counter products are approved for temporary relief of arthritis pain.   In  some countries, doctors often prescribe topical NSAID's for relief of chronic conditions such as arthritis and tendinitis. A review of treatment with NSAID creams found that they worked as well as oral medications but without the serious side effects.  PREVENTION  Maintain a healthy weight. Extra pounds put more strain on your joints.   Get strong, stay limber. Weak muscles are a common cause of knee injuries. Stretching is important. Include flexibility exercises in your workouts.   Be smart about exercise. If you have osteoarthritis, chronic knee pain or recurring injuries, you may need to change the way you exercise. This does not mean you have to stop being active. If your knees ache after jogging or playing basketball, consider switching to swimming, water aerobics or other low-impact activities, at least for a few days a week. Sometimes limiting high-impact activities will provide relief.   Make sure your shoes fit well. Choose footwear that is right for your sport.   Protect your knees. Use the proper gear  for knee-sensitive activities. Use kneepads when playing volleyball or laying carpet. Buckle your seat belt every time you drive. Most shattered kneecaps occur in car accidents.   Rest when you are tired.  SEEK MEDICAL CARE IF:  You have knee pain that is continual and does not seem to be getting better.  SEEK IMMEDIATE MEDICAL CARE IF:  Your knee joint feels hot to the touch and you have a high fever. MAKE SURE YOU:   Understand these instructions.   Will watch your condition.   Will get help right away if you are not doing well or get worse.  Document Released: 01/23/2007 Document Revised: 03/17/2011 Document Reviewed: 01/23/2007 [facility] Patient Information 2012 [place], [place].

## 2011-08-09 NOTE — ED Notes (Signed)
Onset couple of weeks ago right knee pain no trauma. Pain worsening overtime. Pain currently 6/10 achy at rest and pain 10/10 sharp with movement.

## 2011-08-09 NOTE — ED Provider Notes (Signed)
Medical screening examination/treatment/procedure(s) were performed by non-physician practitioner and as supervising physician I was immediately available for consultation/collaboration.   Dayton Bailiff, MD 08/09/11 1747

## 2013-01-14 ENCOUNTER — Emergency Department (HOSPITAL_COMMUNITY): Payer: Managed Care, Other (non HMO)

## 2013-01-14 ENCOUNTER — Emergency Department (HOSPITAL_COMMUNITY)
Admission: EM | Admit: 2013-01-14 | Discharge: 2013-01-15 | Disposition: A | Discharge status: Home / Self Care | Payer: Managed Care, Other (non HMO) | Attending: Emergency Medicine | Admitting: Emergency Medicine

## 2013-01-14 ENCOUNTER — Encounter (HOSPITAL_COMMUNITY): Payer: Self-pay | Admitting: Emergency Medicine

## 2013-01-14 DIAGNOSIS — Y9389 Activity, other specified: Secondary | ICD-10-CM | POA: Insufficient documentation

## 2013-01-14 DIAGNOSIS — Z79899 Other long term (current) drug therapy: Secondary | ICD-10-CM | POA: Insufficient documentation

## 2013-01-14 DIAGNOSIS — K219 Gastro-esophageal reflux disease without esophagitis: Secondary | ICD-10-CM | POA: Insufficient documentation

## 2013-01-14 DIAGNOSIS — Z8669 Personal history of other diseases of the nervous system and sense organs: Secondary | ICD-10-CM | POA: Insufficient documentation

## 2013-01-14 DIAGNOSIS — Y929 Unspecified place or not applicable: Secondary | ICD-10-CM | POA: Insufficient documentation

## 2013-01-14 DIAGNOSIS — S90129A Contusion of unspecified lesser toe(s) without damage to nail, initial encounter: Secondary | ICD-10-CM | POA: Insufficient documentation

## 2013-01-14 DIAGNOSIS — Z8659 Personal history of other mental and behavioral disorders: Secondary | ICD-10-CM | POA: Insufficient documentation

## 2013-01-14 DIAGNOSIS — S90112A Contusion of left great toe without damage to nail, initial encounter: Secondary | ICD-10-CM

## 2013-01-14 DIAGNOSIS — F17200 Nicotine dependence, unspecified, uncomplicated: Secondary | ICD-10-CM | POA: Insufficient documentation

## 2013-01-14 DIAGNOSIS — IMO0002 Reserved for concepts with insufficient information to code with codable children: Secondary | ICD-10-CM | POA: Insufficient documentation
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
## 2013-01-15 MED ORDER — HYDROCODONE-ACETAMINOPHEN 5-325 MG PO TABS
1.0000 | ORAL_TABLET | Freq: Once | ORAL | Status: AC
  Administered 2013-01-15: 1 via ORAL
  Filled 2013-01-15: qty 1

## 2013-01-15 MED ORDER — HYDROCODONE-ACETAMINOPHEN 5-325 MG PO TABS: 1.0000 | ORAL_TABLET | ORAL | Status: AC | PRN

## 2013-01-15 NOTE — Progress Notes (Signed)
Orthopedic Tech Progress Note Patient Details:  [name] February 10, 1966 [id number]  Ortho Devices Type of Ortho Device: Buddy tape;Postop shoe/boot   Haskell Flirt 01/15/2013, 12:54 AM

## 2013-01-15 NOTE — ED Provider Notes (Signed)
Medical screening examination/treatment/procedure(s) were performed by non-physician practitioner and as supervising physician I was immediately available for consultation/collaboration.   Kayvon Mo, MD 01/15/13 0452 

## 2013-03-09 ENCOUNTER — Encounter (HOSPITAL_COMMUNITY): Payer: Self-pay | Admitting: Emergency Medicine

## 2013-03-09 ENCOUNTER — Emergency Department (HOSPITAL_COMMUNITY)
Admission: EM | Admit: 2013-03-09 | Discharge: 2013-03-10 | Disposition: A | Discharge status: Home / Self Care | Payer: No Typology Code available for payment source | Attending: Emergency Medicine | Admitting: Emergency Medicine

## 2013-03-09 ENCOUNTER — Emergency Department (HOSPITAL_COMMUNITY): Payer: No Typology Code available for payment source

## 2013-03-09 DIAGNOSIS — Z9861 Coronary angioplasty status: Secondary | ICD-10-CM | POA: Insufficient documentation

## 2013-03-09 DIAGNOSIS — S335XXA Sprain of ligaments of lumbar spine, initial encounter: Secondary | ICD-10-CM

## 2013-03-09 DIAGNOSIS — F1010 Alcohol abuse, uncomplicated: Secondary | ICD-10-CM | POA: Insufficient documentation

## 2013-03-09 DIAGNOSIS — S62309A Unspecified fracture of unspecified metacarpal bone, initial encounter for closed fracture: Secondary | ICD-10-CM

## 2013-03-09 DIAGNOSIS — F17200 Nicotine dependence, unspecified, uncomplicated: Secondary | ICD-10-CM | POA: Insufficient documentation

## 2013-03-09 DIAGNOSIS — S63509A Unspecified sprain of unspecified wrist, initial encounter: Secondary | ICD-10-CM | POA: Insufficient documentation

## 2013-03-09 DIAGNOSIS — S46912A Strain of unspecified muscle, fascia and tendon at shoulder and upper arm level, left arm, initial encounter: Secondary | ICD-10-CM

## 2013-03-09 DIAGNOSIS — Z8659 Personal history of other mental and behavioral disorders: Secondary | ICD-10-CM | POA: Insufficient documentation

## 2013-03-09 DIAGNOSIS — S63501A Unspecified sprain of right wrist, initial encounter: Secondary | ICD-10-CM

## 2013-03-09 DIAGNOSIS — Z8669 Personal history of other diseases of the nervous system and sense organs: Secondary | ICD-10-CM | POA: Insufficient documentation

## 2013-03-09 DIAGNOSIS — Y92410 Unspecified street and highway as the place of occurrence of the external cause: Secondary | ICD-10-CM | POA: Insufficient documentation

## 2013-03-09 DIAGNOSIS — Y9389 Activity, other specified: Secondary | ICD-10-CM | POA: Insufficient documentation

## 2013-03-09 DIAGNOSIS — S62319A Displaced fracture of base of unspecified metacarpal bone, initial encounter for closed fracture: Secondary | ICD-10-CM | POA: Insufficient documentation

## 2013-03-09 DIAGNOSIS — IMO0002 Reserved for concepts with insufficient information to code with codable children: Secondary | ICD-10-CM | POA: Insufficient documentation

## 2013-03-09 DIAGNOSIS — Z79899 Other long term (current) drug therapy: Secondary | ICD-10-CM | POA: Insufficient documentation

## 2013-03-09 DIAGNOSIS — K219 Gastro-esophageal reflux disease without esophagitis: Secondary | ICD-10-CM | POA: Insufficient documentation
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
## 2013-03-10 ENCOUNTER — Encounter (HOSPITAL_COMMUNITY): Payer: Self-pay | Admitting: Emergency Medicine

## 2013-03-10 MED ORDER — HYDROCODONE-ACETAMINOPHEN 5-325 MG PO TABS
2.0000 | ORAL_TABLET | Freq: Once | ORAL | Status: AC
  Administered 2013-03-10: 2 via ORAL
  Filled 2013-03-10: qty 2

## 2013-03-10 MED ORDER — CYCLOBENZAPRINE HCL 5 MG PO TABS: 5.0000 mg | ORAL_TABLET | Freq: Three times a day (TID) | ORAL | Status: AC | PRN

## 2013-03-10 MED ORDER — HYDROCODONE-ACETAMINOPHEN 5-325 MG PO TABS: ORAL_TABLET | ORAL | Status: AC

## 2013-03-10 NOTE — ED Notes (Signed)
Patient as ride home with family

## 2013-03-10 NOTE — Discharge Instructions (Signed)
 Hand Fracture Your caregiver has diagnosed you with a fractured (broken) bone in your hand. If the bones are in good position and the hand is properly immobilized and rested, these injuries will usually heal in 3 to 6 weeks. A cast, splint, or bulky bandage is usually applied to keep the fracture site from moving. Do not remove the splint or cast until your caregiver approves. If the fracture is unstable or the bones are not aligned properly, surgery may be needed. Keep your hand raised (elevated) above the level of your heart as much as possible for the next 2 to 3 days until the swelling and pain are better. Apply ice packs for 15-20 minutes every 3 to 4 hours to help control the pain and swelling. See your caregiver or an orthopedic specialist as directed for follow-up care to make sure the fracture is beginning to heal properly. SEEK IMMEDIATE MEDICAL CARE IF:   You notice your fingers are cold, numb, crooked, or the pain of your injury is severe.  You are not improving or seem to be getting worse.  You have questions or concerns. Document Released: 05/05/2004 Document Revised: 06/20/2011 Document Reviewed: 09/23/2008 [facility] Patient Information 2014 [place], [place].     Wrist Pain Wrist injuries are frequent in adults and children. A sprain is an injury to the ligaments that hold your bones together. A strain is an injury to muscle or muscle cord-like structures (tendons) from stretching or pulling. Generally, when wrists are moderately tender to touch following a fall or injury, a break in the bone (fracture) may be present. Most wrist sprains or strains are better in 3 to 5 days, but complete healing may take several weeks. HOME CARE INSTRUCTIONS   Put ice on the injured area.  Put ice in a plastic bag.  Place a towel between your skin and the bag.  Leave the ice on for 15-20 minutes, 03-04 times a day, for the first 2 days.  Keep your arm raised above the level of your heart  whenever possible to reduce swelling and pain.  Rest the injured area for at least 48 hours or as directed by your caregiver.  If a splint or elastic bandage has been applied, use it for as long as directed by your caregiver or until seen by a caregiver for a follow-up exam.  Only take over-the-counter or prescription medicines for pain, discomfort, or fever as directed by your caregiver.  Keep all follow-up appointments. You may need to follow up with a specialist or have follow-up X-rays. Improvement in pain level is not a guarantee that you did not fracture a bone in your wrist. The only way to determine whether or not you have a broken bone is by X-ray. SEEK IMMEDIATE MEDICAL CARE IF:   Your fingers are swollen, very red, white, or cold and blue.  Your fingers are numb or tingling.  You have increasing pain.  You have difficulty moving your fingers. MAKE SURE YOU:   Understand these instructions.  Will watch your condition.  Will get help right away if you are not doing well or get worse. Document Released: 01/05/2005 Document Revised: 06/20/2011 Document Reviewed: 05/19/2010 [facility] Patient Information 2014 [place], [place].    Narcotic and benzodiazepine use may cause drowsiness, slowed breathing or dependence.  Please use with caution and do not drive, operate machinery or watch young children alone while taking them.  Taking combinations of these medications or drinking alcohol will potentiate these effects.
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
## 2017-03-16 ENCOUNTER — Emergency Department: Payer: No Typology Code available for payment source

## 2017-03-16 ENCOUNTER — Emergency Department
Admission: EM | Admit: 2017-03-16 | Discharge: 2017-03-16 | Disposition: A | Discharge status: Home / Self Care | Payer: No Typology Code available for payment source | Attending: Emergency Medicine | Admitting: Emergency Medicine

## 2017-03-16 ENCOUNTER — Encounter: Payer: Self-pay | Admitting: Emergency Medicine

## 2017-03-16 ENCOUNTER — Other Ambulatory Visit: Payer: Self-pay

## 2017-03-16 DIAGNOSIS — Z79899 Other long term (current) drug therapy: Secondary | ICD-10-CM | POA: Insufficient documentation

## 2017-03-16 DIAGNOSIS — F17210 Nicotine dependence, cigarettes, uncomplicated: Secondary | ICD-10-CM | POA: Diagnosis not present

## 2017-03-16 DIAGNOSIS — R1032 Left lower quadrant pain: Secondary | ICD-10-CM | POA: Diagnosis present

## 2017-03-16 DIAGNOSIS — K5792 Diverticulitis of intestine, part unspecified, without perforation or abscess without bleeding: Secondary | ICD-10-CM | POA: Insufficient documentation

## 2017-03-16 LAB — CBC
HCT: 53.1 % — ABNORMAL HIGH (ref 40.0–52.0)
Hemoglobin: 18.4 g/dL — ABNORMAL HIGH (ref 13.0–18.0)
MCH: 29.9 pg (ref 26.0–34.0)
MCHC: 34.6 g/dL (ref 32.0–36.0)
MCV: 86.4 fL (ref 80.0–100.0)
PLATELETS: 234 10*3/uL (ref 150–440)
RBC: 6.15 MIL/uL — ABNORMAL HIGH (ref 4.40–5.90)
RDW: 13.3 % (ref 11.5–14.5)
WBC: 10 10*3/uL (ref 3.8–10.6)

## 2017-03-16 LAB — COMPREHENSIVE METABOLIC PANEL
ALK PHOS: 83 U/L (ref 38–126)
ALT: 49 U/L (ref 17–63)
AST: 46 U/L — AB (ref 15–41)
Albumin: 3.9 g/dL (ref 3.5–5.0)
Anion gap: 10 (ref 5–15)
BUN: 12 mg/dL (ref 6–20)
CO2: 22 mmol/L (ref 22–32)
CREATININE: 0.77 mg/dL (ref 0.61–1.24)
Calcium: 9.2 mg/dL (ref 8.9–10.3)
Chloride: 101 mmol/L (ref 101–111)
GFR calc Af Amer: >60 mL/min (ref >60)
GFR calc non Af Amer: >60 mL/min (ref >60)
GLUCOSE: 176 mg/dL — AB (ref 65–99)
Potassium: 4.1 mmol/L (ref 3.5–5.1)
SODIUM: 133 mmol/L — AB (ref 135–145)
Total Bilirubin: 0.8 mg/dL (ref 0.3–1.2)
Total Protein: 7.5 g/dL (ref 6.5–8.1)

## 2017-03-16 LAB — URINALYSIS, COMPLETE (UACMP) WITH MICROSCOPIC
Bacteria, UA: NONE SEEN
Bilirubin Urine: NEGATIVE
Glucose, UA: NEGATIVE mg/dL
Hgb urine dipstick: NEGATIVE
Ketones, ur: NEGATIVE mg/dL
Leukocytes, UA: NEGATIVE
Nitrite: NEGATIVE
PH: 6 (ref 5.0–8.0)
PROTEIN: NEGATIVE mg/dL
SPECIFIC GRAVITY, URINE: 1.012 (ref 1.005–1.030)

## 2017-03-16 LAB — LIPASE, BLOOD: LIPASE: 19 U/L (ref 11–51)

## 2017-03-16 MED ORDER — GI COCKTAIL ~~LOC~~
30.0000 mL | ORAL | Status: AC
  Administered 2017-03-16: 30 mL via ORAL

## 2017-03-16 MED ORDER — FAMOTIDINE 20 MG PO TABS
40.0000 mg | ORAL_TABLET | Freq: Once | ORAL | Status: AC
  Administered 2017-03-16: 40 mg via ORAL

## 2017-03-16 MED ORDER — IOPAMIDOL (ISOVUE-300) INJECTION 61%
30.0000 mL | Freq: Once | INTRAVENOUS | Status: AC
  Administered 2017-03-16: 30 mL via ORAL
  Filled 2017-03-16: qty 30

## 2017-03-16 MED ORDER — METOCLOPRAMIDE HCL 5 MG/ML IJ SOLN
INTRAMUSCULAR | Status: AC
  Administered 2017-03-16: 10 mg via INTRAVENOUS
  Filled 2017-03-16: qty 2

## 2017-03-16 MED ORDER — GI COCKTAIL ~~LOC~~
ORAL | Status: AC
  Administered 2017-03-16: 30 mL via ORAL
  Filled 2017-03-16: qty 30

## 2017-03-16 MED ORDER — SENNOSIDES-DOCUSATE SODIUM 8.6-50 MG PO TABS: 2.0000 | ORAL_TABLET | Freq: Two times a day (BID) | ORAL | 0 refills | Status: AC

## 2017-03-16 MED ORDER — ONDANSETRON HCL 4 MG/2ML IJ SOLN
4.0000 mg | Freq: Once | INTRAMUSCULAR | Status: AC
  Administered 2017-03-16: 4 mg via INTRAVENOUS

## 2017-03-16 MED ORDER — METOCLOPRAMIDE HCL 5 MG/ML IJ SOLN
10.0000 mg | Freq: Once | INTRAMUSCULAR | Status: AC
  Administered 2017-03-16: 10 mg via INTRAVENOUS

## 2017-03-16 MED ORDER — NAPROXEN 500 MG PO TABS: 500.0000 mg | ORAL_TABLET | Freq: Two times a day (BID) | ORAL | 0 refills | Status: AC

## 2017-03-16 MED ORDER — ONDANSETRON HCL 4 MG/2ML IJ SOLN
INTRAMUSCULAR | Status: AC
  Administered 2017-03-16: 4 mg via INTRAVENOUS
  Filled 2017-03-16: qty 2

## 2017-03-16 MED ORDER — FAMOTIDINE 20 MG PO TABS
ORAL_TABLET | ORAL | Status: AC
  Administered 2017-03-16: 40 mg via ORAL
  Filled 2017-03-16: qty 2

## 2017-03-16 MED ORDER — SODIUM CHLORIDE 0.9 % IV BOLUS (SEPSIS)
1000.0000 mL | Freq: Once | INTRAVENOUS | Status: AC
  Administered 2017-03-16: 1000 mL via INTRAVENOUS

## 2017-03-16 MED ORDER — IOPAMIDOL (ISOVUE-300) INJECTION 61%
100.0000 mL | Freq: Once | INTRAVENOUS | Status: AC | PRN
Start: 2017-03-16 — End: 2017-03-16
  Administered 2017-03-16: 100 mL via INTRAVENOUS
  Filled 2017-03-16: qty 100

## 2017-03-16 NOTE — ED Triage Notes (Addendum)
Pt c/o sharp pain in abdominal area LUQ that is intermittently sharp and constantly aching since yesterday afternoon.  Pt c/o nausea, no vomiting, had diarrhea a few days ago.   Reports poor appetite.  Pain is 6/10. No meds taken for pain.  Pt states he has had similar pain in the past with gastritis with an ulcer about 20 years ago.  Pt denies any black or bloody stools.   Pt takes daily Prilosec last dose was today.

## 2017-03-16 NOTE — ED Provider Notes (Signed)
[facility] Emergency Department Provider Note  ____________________________________________  Time seen: Approximately 5:13 PM  I have reviewed the triage vital signs and the nursing notes.   HISTORY  Chief Complaint Abdominal Pain    HPI [name] is a 51 y.o. male who complains of sharp left-sided abdominal pain that started yesterday. Constant, aching, nonradiating. No aggravating or alleviating factors. No black or bloody stool. Has nausea but no vomiting. Reminds him of when he had gastritis a long time ago, 20 or 30 years ago. He also had an episode of diverticulitis in the past and this does feel similar to that as well. No fevers chills or sweats. Moderate intensity.     Past Medical History:  Diagnosis Date  . Bipolar 1 disorder (HCC)   . ETOH abuse   . GERD (gastroesophageal reflux disease)   . Left carpal tunnel syndrome 03/11/2011  . Neuromuscular disorder (HCC)    numbness lt hand     Patient Active Problem List   Diagnosis Date Noted  . Left carpal tunnel syndrome 03/11/2011     Past Surgical History:  Procedure Laterality Date  . CARDIAC CATHETERIZATION  2004   normal  . CARPAL TUNNEL RELEASE  03/11/2011   Procedure: CARPAL TUNNEL RELEASE;  Surgeon: Eulas PostJoshua P Landau;  Location: [facility];  Service: Orthopedics;  Laterality: Left;  Marland Kitchen. MANDIBLE FRACTURE SURGERY  3/12  . SHOULDER ARTHROSCOPY  02/25/2011   Procedure: ARTHROSCOPY SHOULDER;  Surgeon: Eulas PostJoshua P Landau;  Location: [facility];  Service: Orthopedics;  Laterality: Left;  arthroscopy left shoulder, Debridement of Labral Tear  . STERIOD INJECTION  02/25/2011   Procedure: STEROID INJECTION;  Surgeon: Eulas PostJoshua P Landau;  Location: [facility];  Service: Orthopedics;  Laterality: Bilateral;  Bilateral wrist injections for carpal tunnel syndrome     Prior to Admission medications   Medication Sig Start Date End Date Taking?  Authorizing Provider  cyclobenzaprine (FLEXERIL) 5 MG tablet Take 1 tablet (5 mg total) by mouth 3 (three) times daily as needed for muscle spasms. 03/10/13   Quita SkyeGhim, Michael, MD  HYDROcodone-acetaminophen (NORCO/VICODIN) 5-325 MG per tablet Take 1 tablet by mouth every 4 (four) hours as needed for pain. 01/15/13   Cherrie DistanceSanford, Frances, PA-C  HYDROcodone-acetaminophen (NORCO/VICODIN) 5-325 MG per tablet 1-2 tablets po q 6 hours prn moderate to severe pain 03/10/13   Quita SkyeGhim, Michael, MD  ibuprofen (ADVIL,MOTRIN) 200 MG tablet Take 200-400 mg by mouth every 6 (six) hours as needed. As needed for pain.    [provider]  naproxen (NAPROSYN) 500 MG tablet Take 1 tablet (500 mg total) by mouth 2 (two) times daily with a meal. 03/16/17   Sharman CheekStafford, Ben Sanz, MD  naproxen sodium (ANAPROX) 220 MG tablet Take 440 mg by mouth 2 (two) times daily as needed. As needed for pain.    [provider]  omeprazole (PRILOSEC) 20 MG capsule Take 20 mg by mouth daily.     [provider]  senna-docusate (SENOKOT-S) 8.6-50 MG tablet Take 2 tablets by mouth 2 (two) times daily. 03/16/17   Sharman CheekStafford, Valoree Agent, MD     Allergies Patient has no known allergies.   History reviewed. No pertinent family history.  Social History Social History   Tobacco Use  . Smoking status: Current Every Day Smoker    Packs/day: 2.00    Years: 25.00    Pack years: 50.00    Types: Cigarettes  . Smokeless tobacco: Never Used  Substance Use  Topics  . Alcohol use: No  . Drug use: No    Review of Systems  Constitutional:   No fever or chills.  ENT:   No sore throat. No rhinorrhea. Cardiovascular:   No chest pain or syncope. Respiratory:   No dyspnea or cough. Gastrointestinal: Positive as above for abdominal pain without vomiting and diarrhea.  Musculoskeletal:   Negative for focal pain or swelling All other systems reviewed and are negative except as documented above in ROS and  HPI.  ____________________________________________   PHYSICAL EXAM:  VITAL SIGNS: ED Triage Vitals  Enc Vitals Group     BP 03/16/17 1217 (!) 159/89     Pulse Rate 03/16/17 1217 93     Resp 03/16/17 1217 18     Temp 03/16/17 1217 98.6 F (37 C)     Temp Source 03/16/17 1217 Oral     SpO2 03/16/17 1217 97 %     Weight 03/16/17 1218 230 lb (104.3 kg)     Height 03/16/17 1218 5\' 10"  (1.778 m)     Head Circumference --      Peak Flow --      Pain Score 03/16/17 1217 6     Pain Loc --      Pain Edu? --      Excl. in GC? --     Vital signs reviewed, nursing assessments reviewed.   Constitutional:   Alert and oriented. Well appearing and in no distress. Eyes:   No scleral icterus.  EOMI. No nystagmus. No conjunctival pallor. PERRL. ENT   Head:   Normocephalic and atraumatic.   Nose:   No congestion/rhinnorhea.    Mouth/Throat:   MMM, no pharyngeal erythema. No peritonsillar mass.    Neck:   No meningismus. Full ROM. Hematological/Lymphatic/Immunilogical:   No cervical lymphadenopathy. Cardiovascular:   RRR. Symmetric bilateral radial and DP pulses.  No murmurs.  Respiratory:   Normal respiratory effort without tachypnea/retractions. Breath sounds are clear and equal bilaterally. No wheezes/rales/rhonchi. Gastrointestinal:   Soft with focal left lower quadrant tenderness. Non distended. There is no CVA tenderness.  No rebound, rigidity, or guarding. Genitourinary:   deferred Musculoskeletal:   Normal range of motion in all extremities. No joint effusions.  No lower extremity tenderness.  No edema. Neurologic:   Normal speech and language.  Motor grossly intact. No gross focal neurologic deficits are appreciated.  Skin:    Skin is warm, dry and intact. No rash noted.  No petechiae, purpura, or bullae.  ____________________________________________    LABS (pertinent positives/negatives) (all labs ordered are listed, but only abnormal results are displayed) Labs  Reviewed  COMPREHENSIVE METABOLIC PANEL - Abnormal; Notable for the following components:      Result Value   Sodium 133 (*)    Glucose, Bld 176 (*)    AST 46 (*)    All other components within normal limits  CBC - Abnormal; Notable for the following components:   RBC 6.15 (*)    Hemoglobin 18.4 (*)    HCT 53.1 (*)    All other components within normal limits  URINALYSIS, COMPLETE (UACMP) WITH MICROSCOPIC - Abnormal; Notable for the following components:   Color, Urine YELLOW (*)    APPearance CLEAR (*)    Squamous Epithelial / LPF 0-5 (*)    All other components within normal limits  LIPASE, BLOOD   ____________________________________________   EKG    ____________________________________________    RADIOLOGY  Ct Abdomen Pelvis W Contrast  Result Date: 03/16/2017  CLINICAL DATA:  Abdominal pain and nausea EXAM: CT ABDOMEN AND PELVIS WITH CONTRAST TECHNIQUE: Multidetector CT imaging of the abdomen and pelvis was performed using the standard protocol following bolus administration of intravenous contrast. CONTRAST:  ISOVUE-300 IOPAMIDOL (ISOVUE-300) INJECTION 61% COMPARISON:  CT abdomen May 03, 2006 FINDINGS: Lower chest: Lung bases are clear. Hepatobiliary: Liver measures 21.5 cm in length. There is diffuse hepatic steatosis. No focal liver lesions are apparent. Gallbladder wall is not appreciably thickened. There is no biliary duct dilatation. Pancreas: No pancreatic mass or inflammatory focus. Spleen: There is a small calcified granuloma in the anterior spleen. Spleen otherwise appears normal. Adrenals/Urinary Tract: Adrenals appear normal bilaterally. Kidneys bilaterally show no evident mass or hydronephrosis on either side. There is no renal or ureteral calculus on either side. Urinary bladder is midline with wall thickness within normal limits. Stomach/Bowel: There are scattered descending colonic and sigmoid diverticula. There is no demonstrable diverticulitis on this  study. There is no bowel wall or mesenteric thickening. No bowel obstruction. No free air or portal venous air. Vascular/Lymphatic: There is no abdominal aortic aneurysm. There are foci of atherosclerotic calcification in the aorta and common iliac arteries. There is also internal iliac artery and common femoral artery atherosclerotic calcification. The major mesenteric arterial vessels appear patent. There is a circumaortic left renal vein, an anatomic variant. Reproductive: There are a few prostatic calculi. Prostate and seminal vesicles appear normal in size and contour. There is no pelvic mass. There is calcification in each seminal vesicle. Other: Appendix appears normal. There is no abscess or ascites in the abdomen or pelvis. Musculoskeletal: There is evidence of old healed posterior left ninth and tenth rib fractures. No blastic or lytic bone lesions are evident. There is no intramuscular or abdominal wall lesion. IMPRESSION: 1. There are left-sided colonic diverticula. No diverticulitis identified on this study. Wall thickening. No bowel obstruction. 2.  No abscess in the abdomen or pelvis.  Appendix appears normal. 3. Prominent liver with hepatic steatosis. No focal liver lesions evident. 4. No renal or ureteral calculus. No hydronephrosis. There are several small prostatic calculi. 5. Aortoiliac atherosclerosis. Scattered foci of mesenteric arterial calcification without mesenteric arterial vascular occlusion. Aortic Atherosclerosis (ICD10-I70.0). Electronically Signed   By: Bretta Bang III M.D.   On: 03/16/2017 16:04    ____________________________________________   PROCEDURES Procedures  ____________________________________________   DIFFERENTIAL DIAGNOSIS  Diverticulitis, bowel perforation, bowel obstruction, intra-abdominal abscess formation, kidney stone  CLINICAL IMPRESSION / ASSESSMENT AND PLAN / ED COURSE  Pertinent labs & imaging results that were available during my care  of the patient were reviewed by me and considered in my medical decision making (see chart for details).   Patient well appearing, not in distress, unremarkable vital signs, presents with abdominal pain and focal exam. Concern for recurrent diverticulitis. However, the differential is broad, and With the patient's obesity as a complicating factor of exam reliability, proceeded with CT scan for further evaluation. This overall is unremarkable, but does show some colon wall thickening in the area of diverticulosis, consistent with early diverticulitis. Not requiring antibiotics according to updated medical Society guidelines, instructed the patient on high fiber diet, prescribed Senokot, follow-up with primary care. Continue his omeprazole that he has been compliant with.Considering the patient's symptoms, medical history, and physical examination today, I have low suspicion for cholecystitis or biliary pathology, pancreatitis, perforation or bowel obstruction, hernia, intra-abdominal abscess, AAA or dissection, volvulus or intussusception, mesenteric ischemia, or appendicitis.        ____________________________________________  FINAL CLINICAL IMPRESSION(S) / ED DIAGNOSES    Final diagnoses:  Left lower quadrant pain  Diverticulitis      This SmartLink is deprecated. Use AVSMEDLIST instead to display the medication list for a patient.   Portions of this note were generated with dragon dictation software. Dictation errors may occur despite best attempts at proofreading.    Sharman CheekStafford, Dabney Dever, MD 03/16/17 [phone]

## 2017-03-16 NOTE — ED Notes (Signed)
Patient is finished drinking his CT contrast.  CT scan dept. Notified.

## 2017-03-16 NOTE — ED Notes (Signed)
Patient is complaining of right upper quadrant pain since yesterday that has been intermittent.  Patient describes pain as sharp.  Patient reports some nausea, denies vomiting.  No obvious distress at this time.

## 2017-08-22 ENCOUNTER — Ambulatory Visit: Payer: No Typology Code available for payment source | Admitting: Internal Medicine

## 2018-01-16 ENCOUNTER — Ambulatory Visit: Payer: No Typology Code available for payment source | Admitting: Internal Medicine
